# Patient Record
Sex: Male | Born: 1950 | Race: Black or African American | Hispanic: No | Marital: Married
Health system: Southern US, Community
[De-identification: ages and names within clinical notes are randomized; demographics above are authoritative.]

## PROBLEM LIST (undated history)

## (undated) DIAGNOSIS — J9621 Acute and chronic respiratory failure with hypoxia: Secondary | ICD-10-CM

## (undated) DIAGNOSIS — F4310 Post-traumatic stress disorder, unspecified: Secondary | ICD-10-CM

## (undated) DIAGNOSIS — J15211 Pneumonia due to Methicillin susceptible Staphylococcus aureus: Secondary | ICD-10-CM

## (undated) DIAGNOSIS — N170 Acute kidney failure with tubular necrosis: Secondary | ICD-10-CM

## (undated) DIAGNOSIS — I5032 Chronic diastolic (congestive) heart failure: Secondary | ICD-10-CM

---

## 2019-03-16 ENCOUNTER — Inpatient Hospital Stay
Admission: RE | Admit: 2019-03-16 | Discharge: 2019-04-09 | Disposition: A | Discharge status: Skilled Nursing Facility | Payer: Medicare PPO | Attending: Internal Medicine | Admitting: Internal Medicine

## 2019-03-16 ENCOUNTER — Other Ambulatory Visit (HOSPITAL_COMMUNITY): Payer: Medicare PPO

## 2019-03-16 DIAGNOSIS — J15211 Pneumonia due to Methicillin susceptible Staphylococcus aureus: Secondary | ICD-10-CM | POA: Diagnosis present

## 2019-03-16 DIAGNOSIS — F4310 Post-traumatic stress disorder, unspecified: Secondary | ICD-10-CM | POA: Diagnosis not present

## 2019-03-16 DIAGNOSIS — N170 Acute kidney failure with tubular necrosis: Secondary | ICD-10-CM | POA: Diagnosis not present

## 2019-03-16 DIAGNOSIS — J189 Pneumonia, unspecified organism: Secondary | ICD-10-CM

## 2019-03-16 DIAGNOSIS — J9621 Acute and chronic respiratory failure with hypoxia: Secondary | ICD-10-CM

## 2019-03-16 DIAGNOSIS — J9690 Respiratory failure, unspecified, unspecified whether with hypoxia or hypercapnia: Secondary | ICD-10-CM

## 2019-03-16 DIAGNOSIS — I5032 Chronic diastolic (congestive) heart failure: Secondary | ICD-10-CM | POA: Diagnosis present

## 2019-03-16 DIAGNOSIS — R140 Abdominal distension (gaseous): Secondary | ICD-10-CM

## 2019-03-16 DIAGNOSIS — Z931 Gastrostomy status: Secondary | ICD-10-CM

## 2019-03-16 HISTORY — DX: Acute kidney failure with tubular necrosis: N17.0

## 2019-03-16 HISTORY — DX: Pneumonia due to methicillin susceptible Staphylococcus aureus: J15.211

## 2019-03-16 HISTORY — DX: Chronic diastolic (congestive) heart failure: I50.32

## 2019-03-16 HISTORY — DX: Acute and chronic respiratory failure with hypoxia: J96.21

## 2019-03-16 HISTORY — DX: Post-traumatic stress disorder, unspecified: F43.10

## 2019-03-16 NOTE — Consult Note (Signed)
[facility] [place]  PULMONARY SERVICE  Date of Service: 03/16/2019  PULMONARY CRITICAL CARE CONSULT   Dylan Wu  [id number]:[id number]  DOB: April 25, 1950   DOA: 03/16/2019  Referring Physician: Merton Border, MD  HPI: Dylan Wu is a 69 y.o. male seen for follow up of Acute on Chronic Respiratory Failure.  Patient has multiple medical problems including pneumonia acute renal failure hypertension PTSD prostate cancer who presented to the transferring facility with basically with respiratory failure.  Patient was having increasing shortness of breath and cough and congestion.  Had chest x-ray done which showed diffuse interstitial airspace disease and groundglass changes.  Patient was tested for COVID-19 which was negative PCR was also negative.  Eventually patient was found to have MSSA pneumonia.  Patient also was felt to have diastolic heart failure so he was appropriately treated with diuretics.  In addition patient was placed on azithromycin as well as Rocephin along with Decadron.  This was later discontinued once the PCR results were noted.  He did undergo bronchoscopy which is where the cultures came back positive.  Subsequently transferred to our facility for further management and weaning.  Review of Systems:  ROS performed and is unremarkable other than noted above.  Past medical history: Pneumonia Hypertension PTSD Prostate cancer Acute renal failure Ileus Chest pain  Past surgical history: Patient had a tracheostomy PEG  Social history: Unknown about tobacco alcohol or drug abuse  Family history: Noncontributory to the present illness  Medications: Reviewed on Rounds  Physical Exam:  Vitals: Temperature 98.0 pulse 65 respiratory 25 blood pressure is 130/70 saturations 97%  Ventilator Settings off the ventilator right now on T collar with an FiO2 of 40%  . General: Comfortable at this time . Eyes: Grossly normal  lids, irises & conjunctiva . ENT: grossly tongue is normal . Neck: no obvious mass . Cardiovascular: S1-S2 normal no gallop or rub . Respiratory: No rhonchi no rales are noted at this time . Abdomen: Soft and nontender . Skin: no rash seen on limited exam . Musculoskeletal: not rigid . Psychiatric:unable to assess . Neurologic: no seizure no involuntary movements         Labs on Admission:  Basic Metabolic Panel: Recent Labs  Lab 03/17/19 0547  NA 136  K 3.0*  CL 98  CO2 27  GLUCOSE 168*  BUN 24*  CREATININE 0.61  CALCIUM 9.3    No results for input(s): PHART, PCO2ART, PO2ART, HCO3, O2SAT in the last 168 hours.  Liver Function Tests: No results for input(s): AST, ALT, ALKPHOS, BILITOT, PROT, ALBUMIN in the last 168 hours. No results for input(s): LIPASE, AMYLASE in the last 168 hours. No results for input(s): AMMONIA in the last 168 hours.  CBC: Recent Labs  Lab 03/17/19 0547  WBC 8.3  HGB 12.6*  HCT 42.4  MCV 87.4  PLT 210    Cardiac Enzymes: No results for input(s): CKTOTAL, CKMB, CKMBINDEX, TROPONINI in the last 168 hours.  BNP (last 3 results) No results for input(s): BNP in the last 8760 hours.  ProBNP (last 3 results) No results for input(s): PROBNP in the last 8760 hours.   Radiological Exams on Admission: DG Chest Port 1 View  Result Date: 03/16/2019 CLINICAL DATA:  Respiratory failure. EXAM: PORTABLE CHEST 1 VIEW COMPARISON:  None. FINDINGS: The heart size and mediastinal contours are within normal limits. Tracheostomy tube is in grossly good position. Right-sided PICC line is noted with tip in right atrium. No pneumothorax is  noted. Right lung is clear. Mild left basilar subsegmental atelectasis is noted. The visualized skeletal structures are unremarkable. IMPRESSION: Mild left basilar subsegmental atelectasis. Electronically Signed   By: Lupita Raider M.D.   On: 03/16/2019 15:16   DG Abd Portable 1V  Result Date: 03/16/2019 CLINICAL DATA:   Peg tube placement. EXAM: PORTABLE ABDOMEN - 1 VIEW COMPARISON:  None. FINDINGS: Contrast was injected into pre-existing gastrostomy tube. Normal filling of the stomach is noted. No leakage is noted. Gastrostomy tube appears to be well position within gastric lumen. Nasogastric tube tip is seen entering stomach. Mildly dilated small bowel loops are noted which may represent ileus. IMPRESSION: Gastrostomy tube appears to be well positioned within gastric lumen. Mildly dilated small bowel loops are noted which may represent ileus. No leakage is noted. Electronically Signed   By: Lupita Raider M.D.   On: 03/16/2019 15:17    Assessment/Plan Active Problems:   Acute on chronic respiratory failure with hypoxia (HCC)   Chronic diastolic heart failure (HCC)   Pneumonia of lower lobe due to methicillin susceptible Staphylococcus aureus (MSSA) (HCC)   Acute renal failure due to tubular necrosis (HCC)   PTSD (post-traumatic stress disorder)   1. Acute on chronic respiratory failure hypoxia plan is to continue with T collar trials respiratory therapy will assess the patient's readiness for PMV and capping.  As long as he continues to do well we should be able to advance the weaning without any difficulty. 2. Chronic diastolic dysfunction patient has preserved ejection fraction based on the echocardiogram results at the other facility.  We do need to monitor the patient's for the fluid status very closely. 3. MSSA pneumonia treated clinically is resolved we will follow-up radiologically.  Continue with present management patient has been treated with steroids at the other facility. 4. Acute renal failure follow-up on labs which will be ordered for evaluation 5. PTSD at baseline we will continue with supportive care  I have personally seen and evaluated the patient, evaluated laboratory and imaging results, formulated the assessment and plan and placed orders. The Patient requires high complexity decision  making with multiple systems involvement.  Case was discussed on Rounds with the Respiratory Therapy Director and the Respiratory staff Time Spent  Yevonne Pax, MD [facility] Pulmonary Critical Care Medicine Sleep Medicine

## 2019-03-17 ENCOUNTER — Encounter: Payer: Self-pay | Admitting: Internal Medicine

## 2019-03-17 DIAGNOSIS — N170 Acute kidney failure with tubular necrosis: Secondary | ICD-10-CM | POA: Diagnosis not present

## 2019-03-17 DIAGNOSIS — F4310 Post-traumatic stress disorder, unspecified: Secondary | ICD-10-CM | POA: Diagnosis present

## 2019-03-17 DIAGNOSIS — I5032 Chronic diastolic (congestive) heart failure: Secondary | ICD-10-CM | POA: Diagnosis not present

## 2019-03-17 DIAGNOSIS — J9621 Acute and chronic respiratory failure with hypoxia: Secondary | ICD-10-CM | POA: Diagnosis present

## 2019-03-17 DIAGNOSIS — J15211 Pneumonia due to Methicillin susceptible Staphylococcus aureus: Secondary | ICD-10-CM | POA: Diagnosis not present

## 2019-03-17 LAB — COMPREHENSIVE METABOLIC PANEL
ALT: 64 U/L — ABNORMAL HIGH (ref 0–44)
AST: 33 U/L (ref 15–41)
Albumin: 2.7 g/dL — ABNORMAL LOW (ref 3.5–5.0)
Alkaline Phosphatase: 68 U/L (ref 38–126)
Anion gap: 11 (ref 5–15)
BUN: 24 mg/dL — ABNORMAL HIGH (ref 8–23)
CO2: 27 mmol/L (ref 22–32)
Calcium: 9.3 mg/dL (ref 8.9–10.3)
Chloride: 98 mmol/L (ref 98–111)
Creatinine, Ser: 0.61 mg/dL (ref 0.61–1.24)
GFR calc Af Amer: >60 mL/min (ref >60)
GFR calc non Af Amer: >60 mL/min (ref >60)
Glucose, Bld: 168 mg/dL — ABNORMAL HIGH (ref 70–99)
Potassium: 3 mmol/L — ABNORMAL LOW (ref 3.5–5.1)
Sodium: 136 mmol/L (ref 135–145)
Total Bilirubin: 0.7 mg/dL (ref 0.3–1.2)
Total Protein: 5.8 g/dL — ABNORMAL LOW (ref 6.5–8.1)

## 2019-03-17 LAB — CBC
HCT: 42.4 % (ref 39.0–52.0)
Hemoglobin: 12.6 g/dL — ABNORMAL LOW (ref 13.0–17.0)
MCH: 26 pg (ref 26.0–34.0)
MCHC: 29.7 g/dL — ABNORMAL LOW (ref 30.0–36.0)
MCV: 87.4 fL (ref 80.0–100.0)
Platelets: 210 10*3/uL (ref 150–400)
RBC: 4.85 MIL/uL (ref 4.22–5.81)
RDW: 14.4 % (ref 11.5–15.5)
WBC: 8.3 10*3/uL (ref 4.0–10.5)
nRBC: 0 % (ref 0.0–0.2)

## 2019-03-17 NOTE — Progress Notes (Signed)
Pulmonary Critical Care Medicine [facility] [place]   PULMONARY CRITICAL CARE SERVICE  PROGRESS NOTE  Date of Service: 03/17/2019  Dylan Wu  YQM:[id number]  DOB: October 03, 1950   DOA: 03/16/2019  Referring Physician: Carron Curie, MD  HPI: Dylan Wu is a 68 y.o. male seen for follow up of Acute on Chronic Respiratory Failure.  Patient currently is on T collar has been on 35% FiO2 good saturations are noted.  We should be able to progress to capping trials  Medications: Reviewed on Rounds  Physical Exam:  Vitals: Temperature is 98.6 pulse 74 respiratory rate 20 blood pressure is 145/81 saturations 98%  Ventilator Settings off the ventilator on T collar with an FiO2 45%  . General: Comfortable at this time . Eyes: Grossly normal lids, irises & conjunctiva . ENT: grossly tongue is normal . Neck: no obvious mass . Cardiovascular: S1 S2 normal no gallop . Respiratory: No rhonchi coarse breath sounds are noted . Abdomen: soft . Skin: no rash seen on limited exam . Musculoskeletal: not rigid . Psychiatric:unable to assess . Neurologic: no seizure no involuntary movements         Lab Data:   Basic Metabolic Panel: Recent Labs  Lab 03/17/19 0547  NA 136  K 3.0*  CL 98  CO2 27  GLUCOSE 168*  BUN 24*  CREATININE 0.61  CALCIUM 9.3    ABG: No results for input(s): PHART, PCO2ART, PO2ART, HCO3, O2SAT in the last 168 hours.  Liver Function Tests: Recent Labs  Lab 03/17/19 0547  AST 33  ALT 64*  ALKPHOS 68  BILITOT 0.7  PROT 5.8*  ALBUMIN 2.7*   No results for input(s): LIPASE, AMYLASE in the last 168 hours. No results for input(s): AMMONIA in the last 168 hours.  CBC: Recent Labs  Lab 03/17/19 0547  WBC 8.3  HGB 12.6*  HCT 42.4  MCV 87.4  PLT 210    Cardiac Enzymes: No results for input(s): CKTOTAL, CKMB, CKMBINDEX, TROPONINI in the last 168 hours.  BNP (last 3 results) No results for input(s): BNP in the last 8760  hours.  ProBNP (last 3 results) No results for input(s): PROBNP in the last 8760 hours.  Radiological Exams: DG Chest Port 1 View  Result Date: 03/16/2019 CLINICAL DATA:  Respiratory failure. EXAM: PORTABLE CHEST 1 VIEW COMPARISON:  None. FINDINGS: The heart size and mediastinal contours are within normal limits. Tracheostomy tube is in grossly good position. Right-sided PICC line is noted with tip in right atrium. No pneumothorax is noted. Right lung is clear. Mild left basilar subsegmental atelectasis is noted. The visualized skeletal structures are unremarkable. IMPRESSION: Mild left basilar subsegmental atelectasis. Electronically Signed   By: Lupita Raider M.D.   On: 03/16/2019 15:16   DG Abd Portable 1V  Result Date: 03/16/2019 CLINICAL DATA:  Peg tube placement. EXAM: PORTABLE ABDOMEN - 1 VIEW COMPARISON:  None. FINDINGS: Contrast was injected into pre-existing gastrostomy tube. Normal filling of the stomach is noted. No leakage is noted. Gastrostomy tube appears to be well position within gastric lumen. Nasogastric tube tip is seen entering stomach. Mildly dilated small bowel loops are noted which may represent ileus. IMPRESSION: Gastrostomy tube appears to be well positioned within gastric lumen. Mildly dilated small bowel loops are noted which may represent ileus. No leakage is noted. Electronically Signed   By: Lupita Raider M.D.   On: 03/16/2019 15:17    Assessment/Plan Active Problems:   Acute on chronic respiratory failure with hypoxia (HCC)  Chronic diastolic heart failure (HCC)   Pneumonia of lower lobe due to methicillin susceptible Staphylococcus aureus (MSSA) (HCC)   Acute renal failure due to tubular necrosis (HCC)   PTSD (post-traumatic stress disorder)   1. Acute on chronic respiratory failure with hypoxia patient is doing well with the T collar on 35% FiO2 plan will be to continue with T collar trials as ordered continue secretion management pulmonary  toilet. 2. Chronic diastolic heart failure compensated we will continue with present management 3. MSSA pneumonia treated last chest x-ray that was done shows mild basilar atelectatic changes 4. Acute renal failure due to tubular necrosis we will continue with present management 5. PTSD no changes we will continue to follow along   I have personally seen and evaluated the patient, evaluated laboratory and imaging results, formulated the assessment and plan and placed orders. The Patient requires high complexity decision making with multiple systems involvement.  Rounds were done with the Respiratory Therapy Director and Staff therapists and discussed with nursing staff also.  Allyne Gee, MD [facility] Pulmonary Critical Care Medicine Sleep Medicine

## 2019-03-18 DIAGNOSIS — J9621 Acute and chronic respiratory failure with hypoxia: Secondary | ICD-10-CM | POA: Diagnosis not present

## 2019-03-18 DIAGNOSIS — J15211 Pneumonia due to Methicillin susceptible Staphylococcus aureus: Secondary | ICD-10-CM | POA: Diagnosis not present

## 2019-03-18 DIAGNOSIS — I5032 Chronic diastolic (congestive) heart failure: Secondary | ICD-10-CM | POA: Diagnosis not present

## 2019-03-18 DIAGNOSIS — N170 Acute kidney failure with tubular necrosis: Secondary | ICD-10-CM | POA: Diagnosis not present

## 2019-03-18 LAB — POTASSIUM: Potassium: 2.8 mmol/L — ABNORMAL LOW (ref 3.5–5.1)

## 2019-03-18 NOTE — Progress Notes (Signed)
Pulmonary Critical Care Medicine [name]  PROGRESS NOTE  Date of Service: 03/18/2019  Dylan Wu  [id number]  DOB: 01-Nov-1950   DOA: 03/16/2019  Referring Physician: Merton Border, MD  HPI: Dylan Wu is a 69 y.o. male seen for follow up of Acute on Chronic Respiratory Failure.  Patient is on T collar currently has been on 35% FiO2 doing well without any major issues at this time tracheostomy ready to be changed  Medications: Reviewed on Rounds  Physical Exam:  Vitals: Temperature 98.0 pulse 77 respiratory 29 blood pressure is 127/71 saturations 93%  Ventilator Settings off the ventilator on T collar FiO2 35%  . General: Comfortable at this time . Eyes: Grossly normal lids, irises & conjunctiva . ENT: grossly tongue is normal . Neck: no obvious mass . Cardiovascular: S1 S2 normal no gallop . Respiratory: Coarse breath sounds with few scattered rhonchi . Abdomen: soft . Skin: no rash seen on limited exam . Musculoskeletal: not rigid . Psychiatric:unable to assess . Neurologic: no seizure no involuntary movements         Lab Data:   Basic Metabolic Panel: Recent Labs  Lab 03/17/19 0547  NA 136  K 3.0*  CL 98  CO2 27  GLUCOSE 168*  BUN 24*  CREATININE 0.61  CALCIUM 9.3    ABG: No results for input(s): PHART, PCO2ART, PO2ART, HCO3, O2SAT in the last 168 hours.  Liver Function Tests: Recent Labs  Lab 03/17/19 0547  AST 33  ALT 64*  ALKPHOS 68  BILITOT 0.7  PROT 5.8*  ALBUMIN 2.7*   No results for input(s): LIPASE, AMYLASE in the last 168 hours. No results for input(s): AMMONIA in the last 168 hours.  CBC: Recent Labs  Lab 03/17/19 0547  WBC 8.3  HGB 12.6*  HCT 42.4  MCV 87.4  PLT 210    Cardiac Enzymes: No results for input(s): CKTOTAL, CKMB, CKMBINDEX, TROPONINI in the last 168 hours.  BNP (last 3 results) No results for input(s): BNP in the last 8760 hours.  ProBNP  (last 3 results) No results for input(s): PROBNP in the last 8760 hours.  Radiological Exams: DG Chest Port 1 View  Result Date: 03/16/2019 CLINICAL DATA:  Respiratory failure. EXAM: PORTABLE CHEST 1 VIEW COMPARISON:  None. FINDINGS: The heart size and mediastinal contours are within normal limits. Tracheostomy tube is in grossly good position. Right-sided PICC line is noted with tip in right atrium. No pneumothorax is noted. Right lung is clear. Mild left basilar subsegmental atelectasis is noted. The visualized skeletal structures are unremarkable. IMPRESSION: Mild left basilar subsegmental atelectasis. Electronically Signed   By: Marijo Conception M.D.   On: 03/16/2019 15:16   DG Abd Portable 1V  Result Date: 03/16/2019 CLINICAL DATA:  Peg tube placement. EXAM: PORTABLE ABDOMEN - 1 VIEW COMPARISON:  None. FINDINGS: Contrast was injected into pre-existing gastrostomy tube. Normal filling of the stomach is noted. No leakage is noted. Gastrostomy tube appears to be well position within gastric lumen. Nasogastric tube tip is seen entering stomach. Mildly dilated small bowel loops are noted which may represent ileus. IMPRESSION: Gastrostomy tube appears to be well positioned within gastric lumen. Mildly dilated small bowel loops are noted which may represent ileus. No leakage is noted. Electronically Signed   By: Marijo Conception M.D.   On: 03/16/2019 15:17    Assessment/Plan Active Problems:   Acute on chronic respiratory failure with hypoxia (HCC)   Chronic diastolic  heart failure (HCC)   Pneumonia of lower lobe due to methicillin susceptible Staphylococcus aureus (MSSA) (HCC)   Acute renal failure due to tubular necrosis (HCC)   PTSD (post-traumatic stress disorder)   1. Acute on chronic respiratory failure with hypoxia plan is to continue with the weaning on T collar right now is on 35% FiO2 patient will get a change in the tracheostomy done today. 2. Chronic diastolic heart failure right now  is compensated there was some basilar atelectasis noted on the last chest film continue with aggressive pulmonary toilet 3. Pneumonia treated secondary to MSSA pneumonia we will follow 4. Acute renal failure resolved we will continue to follow labs 5. PTSD no change   I have personally seen and evaluated the patient, evaluated laboratory and imaging results, formulated the assessment and plan and placed orders. The Patient requires high complexity decision making with multiple systems involvement.  Time 35 minutes tracheostomy change Rounds were done with the Respiratory Therapy Director and Staff therapists and discussed with nursing staff also.  Yevonne Pax, MD [facility] Pulmonary Critical Care Medicine Sleep Medicine

## 2019-03-19 DIAGNOSIS — J9621 Acute and chronic respiratory failure with hypoxia: Secondary | ICD-10-CM | POA: Diagnosis not present

## 2019-03-19 DIAGNOSIS — I5032 Chronic diastolic (congestive) heart failure: Secondary | ICD-10-CM | POA: Diagnosis not present

## 2019-03-19 DIAGNOSIS — N170 Acute kidney failure with tubular necrosis: Secondary | ICD-10-CM | POA: Diagnosis not present

## 2019-03-19 DIAGNOSIS — J15211 Pneumonia due to Methicillin susceptible Staphylococcus aureus: Secondary | ICD-10-CM | POA: Diagnosis not present

## 2019-03-19 LAB — POTASSIUM: Potassium: 3.3 mmol/L — ABNORMAL LOW (ref 3.5–5.1)

## 2019-03-19 NOTE — Progress Notes (Addendum)
Pulmonary Critical Care Medicine [facility] [place]   PULMONARY CRITICAL CARE SERVICE  PROGRESS NOTE  Date of Service: 03/19/2019  Dylan Wu  ELF:[id number]  DOB: [birth year]   DOA: 03/16/2019  Referring Physician: Carron Curie, MD  HPI: Dylan Wu is a 69 y.o. male seen for follow up of Acute on Chronic Respiratory Failure.  Patient is currently on T collar has been on 40% FiO2  Medications: Reviewed on Rounds  Physical Exam:  Vitals: Temperature 98.3 pulse 86 respiratory rate 26 blood pressure 138/81 saturations 91%  Ventilator Settings mode ventilation is off the ventilator on T collar with an FiO2 40%  . General: Comfortable at this time . Eyes: Grossly normal lids, irises & conjunctiva . ENT: grossly tongue is normal . Neck: no obvious mass . Cardiovascular: S1 S2 normal no gallop . Respiratory: No rhonchi no rales are noted at this time . Abdomen: soft . Skin: no rash seen on limited exam . Musculoskeletal: not rigid . Psychiatric:unable to assess . Neurologic: no seizure no involuntary movements         Lab Data:   Basic Metabolic Panel: Recent Labs  Lab 03/17/19 0547 03/18/19 1222  NA 136  --   K 3.0* 2.8*  CL 98  --   CO2 27  --   GLUCOSE 168*  --   BUN 24*  --   CREATININE 0.61  --   CALCIUM 9.3  --     ABG: No results for input(s): PHART, PCO2ART, PO2ART, HCO3, O2SAT in the last 168 hours.  Liver Function Tests: Recent Labs  Lab 03/17/19 0547  AST 33  ALT 64*  ALKPHOS 68  BILITOT 0.7  PROT 5.8*  ALBUMIN 2.7*   No results for input(s): LIPASE, AMYLASE in the last 168 hours. No results for input(s): AMMONIA in the last 168 hours.  CBC: Recent Labs  Lab 03/17/19 0547  WBC 8.3  HGB 12.6*  HCT 42.4  MCV 87.4  PLT 210    Cardiac Enzymes: No results for input(s): CKTOTAL, CKMB, CKMBINDEX, TROPONINI in the last 168 hours.  BNP (last 3 results) No results for input(s): BNP in the last 8760 hours.  ProBNP  (last 3 results) No results for input(s): PROBNP in the last 8760 hours.  Radiological Exams: No results found.  Assessment/Plan Active Problems:   Acute on chronic respiratory failure with hypoxia (HCC)   Chronic diastolic heart failure (HCC)   Pneumonia of lower lobe due to methicillin susceptible Staphylococcus aureus (MSSA) (HCC)   Acute renal failure due to tubular necrosis (HCC)   PTSD (post-traumatic stress disorder)   1. Acute on chronic respiratory failure with hypoxia plan is to continue with T collar trials as tolerated FiO2 requirements are slightly increased we will need to monitor closely 2. Chronic diastolic heart failure monitor fluid status 3. Pneumonia treated we will continue with supportive care 4. Acute renal failure continue present management 5. PTSD no changes continue with supportive care   I have personally seen and evaluated the patient, evaluated laboratory and imaging results, formulated the assessment and plan and placed orders. The Patient requires high complexity decision making with multiple systems involvement.  Rounds were done with the Respiratory Therapy Director and Staff therapists and discussed with nursing staff also.  Yevonne Pax, MD [facility] Pulmonary Critical Care Medicine Sleep Medicine

## 2019-03-20 ENCOUNTER — Other Ambulatory Visit (HOSPITAL_COMMUNITY): Payer: Medicare PPO

## 2019-03-20 DIAGNOSIS — J9621 Acute and chronic respiratory failure with hypoxia: Secondary | ICD-10-CM | POA: Diagnosis not present

## 2019-03-20 DIAGNOSIS — J15211 Pneumonia due to Methicillin susceptible Staphylococcus aureus: Secondary | ICD-10-CM | POA: Diagnosis not present

## 2019-03-20 DIAGNOSIS — N170 Acute kidney failure with tubular necrosis: Secondary | ICD-10-CM | POA: Diagnosis not present

## 2019-03-20 DIAGNOSIS — I5032 Chronic diastolic (congestive) heart failure: Secondary | ICD-10-CM | POA: Diagnosis not present

## 2019-03-20 NOTE — Progress Notes (Addendum)
Pulmonary Critical Care Medicine [facility] [place]   PULMONARY CRITICAL CARE SERVICE  PROGRESS NOTE  Date of Service: 03/20/2019  Dylan Wu  WVP:[id number]  DOB: April 29, 1950   DOA: 03/16/2019  Referring Physician: Carron Curie, MD  HPI: Dylan Wu is a 69 y.o. male seen for follow up of Acute on Chronic Respiratory Failure.  Patient remains on trach collar 50% FiO2 satting well this time distress.  Medications: Reviewed on Rounds  Physical Exam:  Vitals: Pulse 88 respirations 17 BP 106/65 O2 sat 93% temp 97.4  Ventilator Settings 50% ATC  . General: Comfortable at this time . Eyes: Grossly normal lids, irises & conjunctiva . ENT: grossly tongue is normal . Neck: no obvious mass . Cardiovascular: S1 S2 normal no gallop . Respiratory: No rales or rhonchi noted . Abdomen: soft . Skin: no rash seen on limited exam . Musculoskeletal: not rigid . Psychiatric:unable to assess . Neurologic: no seizure no involuntary movements         Lab Data:   Basic Metabolic Panel: Recent Labs  Lab 03/17/19 0547 03/18/19 1222 03/19/19 1034  NA 136  --   --   K 3.0* 2.8* 3.3*  CL 98  --   --   CO2 27  --   --   GLUCOSE 168*  --   --   BUN 24*  --   --   CREATININE 0.61  --   --   CALCIUM 9.3  --   --     ABG: No results for input(s): PHART, PCO2ART, PO2ART, HCO3, O2SAT in the last 168 hours.  Liver Function Tests: Recent Labs  Lab 03/17/19 0547  AST 33  ALT 64*  ALKPHOS 68  BILITOT 0.7  PROT 5.8*  ALBUMIN 2.7*   No results for input(s): LIPASE, AMYLASE in the last 168 hours. No results for input(s): AMMONIA in the last 168 hours.  CBC: Recent Labs  Lab 03/17/19 0547  WBC 8.3  HGB 12.6*  HCT 42.4  MCV 87.4  PLT 210    Cardiac Enzymes: No results for input(s): CKTOTAL, CKMB, CKMBINDEX, TROPONINI in the last 168 hours.  BNP (last 3 results) No results for input(s): BNP in the last 8760 hours.  ProBNP (last 3 results) No results for  input(s): PROBNP in the last 8760 hours.  Radiological Exams: DG CHEST PORT 1 VIEW  Result Date: 03/20/2019 CLINICAL DATA:  68 year old male with history of pneumonia. EXAM: PORTABLE CHEST 1 VIEW COMPARISON:  Chest x-ray 03/16/2019. FINDINGS: A tracheostomy tube is in place with tip 5.8 cm above the carina. There is a right upper extremity PICC with tip terminating in the right atrium. Lung volumes are low. Bibasilar opacities (right greater than left), favored to reflect predominantly subsegmental atelectasis, although underlying airspace consolidation is not entirely excluded. No definite pleural effusions. No evidence of pulmonary edema. No pneumothorax. Heart size is borderline enlarged, accentuated by low lung volumes, portable AP technique, and slight rotation of the patient to the right which also distorts upper mediastinal contours. IMPRESSION: 1. Support apparatus, as above. 2. Low lung volumes with probable bibasilar subsegmental atelectasis. Electronically Signed   By: Trudie Reed M.D.   On: 03/20/2019 13:50    Assessment/Plan Active Problems:   Acute on chronic respiratory failure with hypoxia (HCC)   Chronic diastolic heart failure (HCC)   Pneumonia of lower lobe due to methicillin susceptible Staphylococcus aureus (MSSA) (HCC)   Acute renal failure due to tubular necrosis [facility])   PTSD (post-traumatic  stress disorder)   1. Acute on chronic respiratory failure with hypoxia patient is currently on 50% aerosol trach collar using PMV with no difficulty.  Continue aggressive pulmonary toilet and supportive measures.  Continue to wean per protocol. 2. Chronic diastolic heart failure monitor fluid status 3. Pneumonia treated we will continue with supportive care 4. Acute renal failure continue present management 5. PTSD no changes continue with supportive care   I have personally seen and evaluated the patient, evaluated laboratory and imaging results, formulated the assessment and  plan and placed orders. The Patient requires high complexity decision making with multiple systems involvement.  Rounds were done with the Respiratory Therapy Director and Staff therapists and discussed with nursing staff also.  Allyne Gee, MD [facility] Pulmonary Critical Care Medicine Sleep Medicine

## 2019-03-21 DIAGNOSIS — I5032 Chronic diastolic (congestive) heart failure: Secondary | ICD-10-CM | POA: Diagnosis not present

## 2019-03-21 DIAGNOSIS — N170 Acute kidney failure with tubular necrosis: Secondary | ICD-10-CM | POA: Diagnosis not present

## 2019-03-21 DIAGNOSIS — J15211 Pneumonia due to Methicillin susceptible Staphylococcus aureus: Secondary | ICD-10-CM | POA: Diagnosis not present

## 2019-03-21 DIAGNOSIS — J9621 Acute and chronic respiratory failure with hypoxia: Secondary | ICD-10-CM | POA: Diagnosis not present

## 2019-03-21 LAB — BASIC METABOLIC PANEL
Anion gap: 10 (ref 5–15)
BUN: 50 mg/dL — ABNORMAL HIGH (ref 8–23)
CO2: 31 mmol/L (ref 22–32)
Calcium: 9.6 mg/dL (ref 8.9–10.3)
Chloride: 98 mmol/L (ref 98–111)
Creatinine, Ser: 0.81 mg/dL (ref 0.61–1.24)
GFR calc Af Amer: >60 mL/min (ref >60)
GFR calc non Af Amer: >60 mL/min (ref >60)
Glucose, Bld: 189 mg/dL — ABNORMAL HIGH (ref 70–99)
Potassium: 2.8 mmol/L — ABNORMAL LOW (ref 3.5–5.1)
Sodium: 139 mmol/L (ref 135–145)

## 2019-03-21 LAB — CBC
HCT: 40.9 % (ref 39.0–52.0)
Hemoglobin: 12.3 g/dL — ABNORMAL LOW (ref 13.0–17.0)
MCH: 25.7 pg — ABNORMAL LOW (ref 26.0–34.0)
MCHC: 30.1 g/dL (ref 30.0–36.0)
MCV: 85.4 fL (ref 80.0–100.0)
Platelets: 223 10*3/uL (ref 150–400)
RBC: 4.79 MIL/uL (ref 4.22–5.81)
RDW: 14.4 % (ref 11.5–15.5)
WBC: 7 10*3/uL (ref 4.0–10.5)
nRBC: 0 % (ref 0.0–0.2)

## 2019-03-21 LAB — MAGNESIUM: Magnesium: 2.5 mg/dL — ABNORMAL HIGH (ref 1.7–2.4)

## 2019-03-21 NOTE — Progress Notes (Signed)
Pulmonary Critical Care Medicine [place]   PULMONARY CRITICAL CARE SERVICE  PROGRESS NOTE  Date of Service: 03/21/2019  Dylan Wu  IPJ:[id number]  DOB: October 12, 1950   DOA: 03/16/2019  Referring Physician: Merton Border, MD  HPI: Dylan Wu is a 69 y.o. male seen for follow up of Acute on Chronic Respiratory Failure.  Patient currently is on T collar has been on 50% FiO2 using PMV  Medications: Reviewed on Rounds  Physical Exam:  Vitals: Temperature 98.0 pulse 77 respiratory 15 blood pressure is 110/46 saturations 94%  Ventilator Settings on T collar with an FiO2 of 50%  . General: Comfortable at this time . Eyes: Grossly normal lids, irises & conjunctiva . ENT: grossly tongue is normal . Neck: no obvious mass . Cardiovascular: S1 S2 normal no gallop . Respiratory: No rhonchi no rales . Abdomen: soft . Skin: no rash seen on limited exam . Musculoskeletal: not rigid . Psychiatric:unable to assess . Neurologic: no seizure no involuntary movements         Lab Data:   Basic Metabolic Panel: Recent Labs  Lab 03/17/19 0547 03/18/19 1222 03/19/19 1034 03/21/19 0707  NA 136  --   --  139  K 3.0* 2.8* 3.3* 2.8*  CL 98  --   --  98  CO2 27  --   --  31  GLUCOSE 168*  --   --  189*  BUN 24*  --   --  50*  CREATININE 0.61  --   --  0.81  CALCIUM 9.3  --   --  9.6  MG  --   --   --  2.5*    ABG: No results for input(s): PHART, PCO2ART, PO2ART, HCO3, O2SAT in the last 168 hours.  Liver Function Tests: Recent Labs  Lab 03/17/19 0547  AST 33  ALT 64*  ALKPHOS 68  BILITOT 0.7  PROT 5.8*  ALBUMIN 2.7*   No results for input(s): LIPASE, AMYLASE in the last 168 hours. No results for input(s): AMMONIA in the last 168 hours.  CBC: Recent Labs  Lab 03/17/19 0547 03/21/19 0707  WBC 8.3 7.0  HGB 12.6* 12.3*  HCT 42.4 40.9  MCV 87.4 85.4  PLT 210 223    Cardiac Enzymes: No results for input(s): CKTOTAL, CKMB, CKMBINDEX,  TROPONINI in the last 168 hours.  BNP (last 3 results) No results for input(s): BNP in the last 8760 hours.  ProBNP (last 3 results) No results for input(s): PROBNP in the last 8760 hours.  Radiological Exams: DG CHEST PORT 1 VIEW  Result Date: 03/20/2019 CLINICAL DATA:  69 year old male with history of pneumonia. EXAM: PORTABLE CHEST 1 VIEW COMPARISON:  Chest x-ray 03/16/2019. FINDINGS: A tracheostomy tube is in place with tip 5.8 cm above the carina. There is a right upper extremity PICC with tip terminating in the right atrium. Lung volumes are low. Bibasilar opacities (right greater than left), favored to reflect predominantly subsegmental atelectasis, although underlying airspace consolidation is not entirely excluded. No definite pleural effusions. No evidence of pulmonary edema. No pneumothorax. Heart size is borderline enlarged, accentuated by low lung volumes, portable AP technique, and slight rotation of the patient to the right which also distorts upper mediastinal contours. IMPRESSION: 1. Support apparatus, as above. 2. Low lung volumes with probable bibasilar subsegmental atelectasis. Electronically Signed   By: Vinnie Langton M.D.   On: 03/20/2019 13:50    Assessment/Plan Active Problems:   Acute on chronic respiratory failure with hypoxia (  HCC)   Chronic diastolic heart failure (HCC)   Pneumonia of lower lobe due to methicillin susceptible Staphylococcus aureus (MSSA) (HCC)   Acute renal failure due to tubular necrosis (HCC)   PTSD (post-traumatic stress disorder)   1. Acute on chronic respiratory failure hypoxia plan is to continue with T collar on 50% FiO2 with PMV would like to advance as tolerated 2. Chronic diastolic heart failure compensated 3. Pneumonia due to MSSA treated improving 4. ATN resolving 5. PTSD at baseline   I have personally seen and evaluated the patient, evaluated laboratory and imaging results, formulated the assessment and plan and placed  orders. The Patient requires high complexity decision making with multiple systems involvement.  Rounds were done with the Respiratory Therapy Director and Staff therapists and discussed with nursing staff also.  Yevonne Pax, MD [facility] Pulmonary Critical Care Medicine Sleep Medicine

## 2019-03-22 DIAGNOSIS — N170 Acute kidney failure with tubular necrosis: Secondary | ICD-10-CM | POA: Diagnosis not present

## 2019-03-22 DIAGNOSIS — I5032 Chronic diastolic (congestive) heart failure: Secondary | ICD-10-CM | POA: Diagnosis not present

## 2019-03-22 DIAGNOSIS — J9621 Acute and chronic respiratory failure with hypoxia: Secondary | ICD-10-CM | POA: Diagnosis not present

## 2019-03-22 DIAGNOSIS — J15211 Pneumonia due to Methicillin susceptible Staphylococcus aureus: Secondary | ICD-10-CM | POA: Diagnosis not present

## 2019-03-22 LAB — POTASSIUM
Potassium: 3 mmol/L — ABNORMAL LOW (ref 3.5–5.1)
Potassium: 3 mmol/L — ABNORMAL LOW (ref 3.5–5.1)

## 2019-03-22 NOTE — Progress Notes (Signed)
Pulmonary Critical Care Medicine [place]   PULMONARY CRITICAL CARE SERVICE  PROGRESS NOTE  Date of Service: 03/22/2019  Dylan Wu  [id number]  DOB: 1950/04/14   DOA: 03/16/2019  Referring Physician: Merton Border, MD  HPI: Dylan Wu is a 69 y.o. male seen for follow up of Acute on Chronic Respiratory Failure.  Patient currently is on T collar has been on 40% FiO2 with good saturations are noted  Medications: Reviewed on Rounds  Physical Exam:  Vitals: Temperature 97.9 pulse 94 respiratory rate 14 blood pressure is 152/70 saturations 93%  Ventilator Settings off the ventilator on T collar  . General: Comfortable at this time . Eyes: Grossly normal lids, irises & conjunctiva . ENT: grossly tongue is normal . Neck: no obvious mass . Cardiovascular: S1 S2 normal no gallop . Respiratory: No rhonchi no rales are noted at this time . Abdomen: soft . Skin: no rash seen on limited exam . Musculoskeletal: not rigid . Psychiatric:unable to assess . Neurologic: no seizure no involuntary movements         Lab Data:   Basic Metabolic Panel: Recent Labs  Lab 03/17/19 0547 03/18/19 1222 03/19/19 1034 03/21/19 0707 03/22/19 0607  NA 136  --   --  139  --   K 3.0* 2.8* 3.3* 2.8* 3.0*  CL 98  --   --  98  --   CO2 27  --   --  31  --   GLUCOSE 168*  --   --  189*  --   BUN 24*  --   --  50*  --   CREATININE 0.61  --   --  0.81  --   CALCIUM 9.3  --   --  9.6  --   MG  --   --   --  2.5*  --     ABG: No results for input(s): PHART, PCO2ART, PO2ART, HCO3, O2SAT in the last 168 hours.  Liver Function Tests: Recent Labs  Lab 03/17/19 0547  AST 33  ALT 64*  ALKPHOS 68  BILITOT 0.7  PROT 5.8*  ALBUMIN 2.7*   No results for input(s): LIPASE, AMYLASE in the last 168 hours. No results for input(s): AMMONIA in the last 168 hours.  CBC: Recent Labs  Lab 03/17/19 0547 03/21/19 0707  WBC 8.3 7.0  HGB 12.6* 12.3*  HCT 42.4 40.9   MCV 87.4 85.4  PLT 210 223    Cardiac Enzymes: No results for input(s): CKTOTAL, CKMB, CKMBINDEX, TROPONINI in the last 168 hours.  BNP (last 3 results) No results for input(s): BNP in the last 8760 hours.  ProBNP (last 3 results) No results for input(s): PROBNP in the last 8760 hours.  Radiological Exams: No results found.  Assessment/Plan Active Problems:   Acute on chronic respiratory failure with hypoxia (HCC)   Chronic diastolic heart failure (HCC)   Pneumonia of lower lobe due to methicillin susceptible Staphylococcus aureus (MSSA) (HCC)   Acute renal failure due to tubular necrosis (HCC)   PTSD (post-traumatic stress disorder)   1. Acute on chronic respiratory failure with hypoxia we will plan on changing a trach and doing capping trials 2. Chronic diastolic heart failure compensated continue with supportive care 3. Pneumonia due to MSSA treated clinically is improving 4. PTSD at baseline 5. Acute renal failure we will monitoring labs   I have personally seen and evaluated the patient, evaluated laboratory and imaging results, formulated the assessment and plan and placed orders.  The Patient requires high complexity decision making with multiple systems involvement.  Time change in care plan potential Rounds were done with the Respiratory Therapy Director and Staff therapists and discussed with nursing staff also.  Yevonne Pax, MD [facility] Pulmonary Critical Care Medicine Sleep Medicine

## 2019-03-23 DIAGNOSIS — J15211 Pneumonia due to Methicillin susceptible Staphylococcus aureus: Secondary | ICD-10-CM | POA: Diagnosis not present

## 2019-03-23 DIAGNOSIS — I5032 Chronic diastolic (congestive) heart failure: Secondary | ICD-10-CM | POA: Diagnosis not present

## 2019-03-23 DIAGNOSIS — N170 Acute kidney failure with tubular necrosis: Secondary | ICD-10-CM | POA: Diagnosis not present

## 2019-03-23 DIAGNOSIS — J9621 Acute and chronic respiratory failure with hypoxia: Secondary | ICD-10-CM | POA: Diagnosis not present

## 2019-03-23 LAB — CULTURE, RESPIRATORY W GRAM STAIN

## 2019-03-23 LAB — POTASSIUM: Potassium: 3.6 mmol/L (ref 3.5–5.1)

## 2019-03-23 NOTE — Progress Notes (Signed)
Pulmonary Critical Care Medicine [facility] [place]   PULMONARY CRITICAL CARE SERVICE  PROGRESS NOTE  Date of Service: 03/23/2019  Dylan Wu  OZH:[id number]  DOB: 1951-01-09   DOA: 03/16/2019  Referring Physician: Carron Curie, MD  HPI: Dylan Wu is a 69 y.o. male seen for follow up of Acute on Chronic Respiratory Failure.  Patient is capping right now 24 hours no distress is noted at this time  Medications: Reviewed on Rounds  Physical Exam:  Vitals: Temperature 96.2 pulse 73 respiratory 17 blood pressure 97/52 saturations 98%  Ventilator Settings capping right now off the ventilator  . General: Comfortable at this time . Eyes: Grossly normal lids, irises & conjunctiva . ENT: grossly tongue is normal . Neck: no obvious mass . Cardiovascular: S1 S2 normal no gallop . Respiratory: No rhonchi no rales are noted at this time . Abdomen: soft . Skin: no rash seen on limited exam . Musculoskeletal: not rigid . Psychiatric:unable to assess . Neurologic: no seizure no involuntary movements         Lab Data:   Basic Metabolic Panel: Recent Labs  Lab 03/17/19 0547 03/18/19 1222 03/19/19 1034 03/21/19 0707 03/22/19 0607 03/22/19 2018 03/23/19 0627  NA 136  --   --  139  --   --   --   K 3.0*   < > 3.3* 2.8* 3.0* 3.0* 3.6  CL 98  --   --  98  --   --   --   CO2 27  --   --  31  --   --   --   GLUCOSE 168*  --   --  189*  --   --   --   BUN 24*  --   --  50*  --   --   --   CREATININE 0.61  --   --  0.81  --   --   --   CALCIUM 9.3  --   --  9.6  --   --   --   MG  --   --   --  2.5*  --   --   --    < > = values in this interval not displayed.    ABG: No results for input(s): PHART, PCO2ART, PO2ART, HCO3, O2SAT in the last 168 hours.  Liver Function Tests: Recent Labs  Lab 03/17/19 0547  AST 33  ALT 64*  ALKPHOS 68  BILITOT 0.7  PROT 5.8*  ALBUMIN 2.7*   No results for input(s): LIPASE, AMYLASE in the last 168 hours. No results for  input(s): AMMONIA in the last 168 hours.  CBC: Recent Labs  Lab 03/17/19 0547 03/21/19 0707  WBC 8.3 7.0  HGB 12.6* 12.3*  HCT 42.4 40.9  MCV 87.4 85.4  PLT 210 223    Cardiac Enzymes: No results for input(s): CKTOTAL, CKMB, CKMBINDEX, TROPONINI in the last 168 hours.  BNP (last 3 results) No results for input(s): BNP in the last 8760 hours.  ProBNP (last 3 results) No results for input(s): PROBNP in the last 8760 hours.  Radiological Exams: No results found.  Assessment/Plan Active Problems:   Acute on chronic respiratory failure with hypoxia (HCC)   Chronic diastolic heart failure (HCC)   Pneumonia of lower lobe due to methicillin susceptible Staphylococcus aureus (MSSA) (HCC)   Acute renal failure due to tubular necrosis (HCC)   PTSD (post-traumatic stress disorder)   1. Acute on chronic respiratory failure hypoxia we will  continue with capping trials once the patient reaches 72 hours plan on decannulation 2. Chronic diastolic heart failure compensated 3. Pneumonia treated improving 4. Acute renal failure no change 5. PTSD treated we will continue with supportive care   I have personally seen and evaluated the patient, evaluated laboratory and imaging results, formulated the assessment and plan and placed orders. The Patient requires high complexity decision making with multiple systems involvement.  Rounds were done with the Respiratory Therapy Director and Staff therapists and discussed with nursing staff also.  Allyne Gee, MD [facility] Pulmonary Critical Care Medicine Sleep Medicine

## 2019-03-24 DIAGNOSIS — J9621 Acute and chronic respiratory failure with hypoxia: Secondary | ICD-10-CM | POA: Diagnosis not present

## 2019-03-24 DIAGNOSIS — N170 Acute kidney failure with tubular necrosis: Secondary | ICD-10-CM | POA: Diagnosis not present

## 2019-03-24 DIAGNOSIS — I5032 Chronic diastolic (congestive) heart failure: Secondary | ICD-10-CM | POA: Diagnosis not present

## 2019-03-24 DIAGNOSIS — J15211 Pneumonia due to Methicillin susceptible Staphylococcus aureus: Secondary | ICD-10-CM | POA: Diagnosis not present

## 2019-03-24 LAB — CBC
HCT: 41.2 % (ref 39.0–52.0)
Hemoglobin: 12 g/dL — ABNORMAL LOW (ref 13.0–17.0)
MCH: 25.8 pg — ABNORMAL LOW (ref 26.0–34.0)
MCHC: 29.1 g/dL — ABNORMAL LOW (ref 30.0–36.0)
MCV: 88.6 fL (ref 80.0–100.0)
Platelets: 257 10*3/uL (ref 150–400)
RBC: 4.65 MIL/uL (ref 4.22–5.81)
RDW: 14.6 % (ref 11.5–15.5)
WBC: 7.4 10*3/uL (ref 4.0–10.5)
nRBC: 0 % (ref 0.0–0.2)

## 2019-03-24 NOTE — Progress Notes (Signed)
Pulmonary Critical Care Medicine [facility] [place]   PULMONARY CRITICAL CARE SERVICE  PROGRESS NOTE  Date of Service: 03/24/2019  Dylan Wu  [id number]:[id number]  DOB: May 18, 1950   DOA: 03/16/2019  Referring Physician: Carron Curie, MD  HPI: Dylan Wu is a 69 y.o. male seen for follow up of Acute on Chronic Respiratory Failure.  Patient currently is capping today will be 48 hours is using 2 L oxygen with good saturations  Medications: Reviewed on Rounds  Physical Exam:  Vitals: Temperature 98.0 pulse 77 respiratory 16 blood pressure is 127/50 saturations 97%  Ventilator Settings capping on 48-hour goal  . General: Comfortable at this time . Eyes: Grossly normal lids, irises & conjunctiva . ENT: grossly tongue is normal . Neck: no obvious mass . Cardiovascular: S1 S2 normal no gallop . Respiratory: No rhonchi no rales are noted . Abdomen: soft . Skin: no rash seen on limited exam . Musculoskeletal: not rigid . Psychiatric:unable to assess . Neurologic: no seizure no involuntary movements         Lab Data:   Basic Metabolic Panel: Recent Labs  Lab 03/19/19 1034 03/21/19 0707 03/22/19 0607 03/22/19 2018 03/23/19 0627  NA  --  139  --   --   --   K 3.3* 2.8* 3.0* 3.0* 3.6  CL  --  98  --   --   --   CO2  --  31  --   --   --   GLUCOSE  --  189*  --   --   --   BUN  --  50*  --   --   --   CREATININE  --  0.81  --   --   --   CALCIUM  --  9.6  --   --   --   MG  --  2.5*  --   --   --     ABG: No results for input(s): PHART, PCO2ART, PO2ART, HCO3, O2SAT in the last 168 hours.  Liver Function Tests: No results for input(s): AST, ALT, ALKPHOS, BILITOT, PROT, ALBUMIN in the last 168 hours. No results for input(s): LIPASE, AMYLASE in the last 168 hours. No results for input(s): AMMONIA in the last 168 hours.  CBC: Recent Labs  Lab 03/21/19 0707 03/24/19 0431  WBC 7.0 7.4  HGB 12.3* 12.0*  HCT 40.9 41.2  MCV 85.4 88.6  PLT 223 257     Cardiac Enzymes: No results for input(s): CKTOTAL, CKMB, CKMBINDEX, TROPONINI in the last 168 hours.  BNP (last 3 results) No results for input(s): BNP in the last 8760 hours.  ProBNP (last 3 results) No results for input(s): PROBNP in the last 8760 hours.  Radiological Exams: No results found.  Assessment/Plan Active Problems:   Acute on chronic respiratory failure with hypoxia (HCC)   Chronic diastolic heart failure (HCC)   Pneumonia of lower lobe due to methicillin susceptible Staphylococcus aureus (MSSA) (HCC)   Acute renal failure due to tubular necrosis (HCC)   PTSD (post-traumatic stress disorder)   1. Acute on chronic respiratory failure hypoxia continue with capping trials as ordered 2 L supplemental oxygen 2. Chronic diastolic heart failure compensated 3. Pneumonia due to MSSA treated improving 4. Acute renal failure following labs 5. PTSD no change   I have personally seen and evaluated the patient, evaluated laboratory and imaging results, formulated the assessment and plan and placed orders. The Patient requires high complexity decision making with multiple systems involvement.  Rounds were done with the Respiratory Therapy Director and Staff therapists and discussed with nursing staff also.  Allyne Gee, MD [facility] Pulmonary Critical Care Medicine Sleep Medicine

## 2019-03-25 DIAGNOSIS — I5032 Chronic diastolic (congestive) heart failure: Secondary | ICD-10-CM | POA: Diagnosis not present

## 2019-03-25 DIAGNOSIS — J9621 Acute and chronic respiratory failure with hypoxia: Secondary | ICD-10-CM | POA: Diagnosis not present

## 2019-03-25 DIAGNOSIS — J15211 Pneumonia due to Methicillin susceptible Staphylococcus aureus: Secondary | ICD-10-CM | POA: Diagnosis not present

## 2019-03-25 DIAGNOSIS — N170 Acute kidney failure with tubular necrosis: Secondary | ICD-10-CM | POA: Diagnosis not present

## 2019-03-25 LAB — BLOOD GAS, ARTERIAL
Acid-Base Excess: 13.8 mmol/L — ABNORMAL HIGH (ref 0.0–2.0)
Bicarbonate: 38.5 mmol/L — ABNORMAL HIGH (ref 20.0–28.0)
FIO2: 32
O2 Saturation: 90.8 %
Patient temperature: 37
pCO2 arterial: 54.7 mmHg — ABNORMAL HIGH (ref 32.0–48.0)
pH, Arterial: 7.462 — ABNORMAL HIGH (ref 7.350–7.450)
pO2, Arterial: 61.4 mmHg — ABNORMAL LOW (ref 83.0–108.0)

## 2019-03-25 LAB — VANCOMYCIN, TROUGH: Vancomycin Tr: 22 ug/mL (ref 15–20)

## 2019-03-25 NOTE — Progress Notes (Signed)
Pulmonary Critical Care Medicine [facility] [place]   PULMONARY CRITICAL CARE SERVICE  PROGRESS NOTE  Date of Service: 03/25/2019  Dylan Wu  QAS:[id number]  DOB: 1950-11-10   DOA: 03/16/2019  Referring Physician: Carron Curie, MD  HPI: Dilan Wu is a 69 y.o. male seen for follow up of Acute on Chronic Respiratory Failure.  Patient is capping right now 72 hours capping trials  Medications: Reviewed on Rounds  Physical Exam:  Vitals: Temperature 97.4 pulse 69 respiratory 20 blood pressure is 103/59 saturations 97%  Ventilator Settings capping off the ventilator  . General: Comfortable at this time . Eyes: Grossly normal lids, irises & conjunctiva . ENT: grossly tongue is normal . Neck: no obvious mass . Cardiovascular: S1 S2 normal no gallop . Respiratory: No rhonchi no rales . Abdomen: soft . Skin: no rash seen on limited exam . Musculoskeletal: not rigid . Psychiatric:unable to assess . Neurologic: no seizure no involuntary movements         Lab Data:   Basic Metabolic Panel: Recent Labs  Lab 03/19/19 1034 03/21/19 0707 03/22/19 0607 03/22/19 2018 03/23/19 0627  NA  --  139  --   --   --   K 3.3* 2.8* 3.0* 3.0* 3.6  CL  --  98  --   --   --   CO2  --  31  --   --   --   GLUCOSE  --  189*  --   --   --   BUN  --  50*  --   --   --   CREATININE  --  0.81  --   --   --   CALCIUM  --  9.6  --   --   --   MG  --  2.5*  --   --   --     ABG: Recent Labs  Lab 03/25/19 0938  PHART 7.462*  PCO2ART 54.7*  PO2ART 61.4*  HCO3 38.5*  O2SAT 90.8    Liver Function Tests: No results for input(s): AST, ALT, ALKPHOS, BILITOT, PROT, ALBUMIN in the last 168 hours. No results for input(s): LIPASE, AMYLASE in the last 168 hours. No results for input(s): AMMONIA in the last 168 hours.  CBC: Recent Labs  Lab 03/21/19 0707 03/24/19 0431  WBC 7.0 7.4  HGB 12.3* 12.0*  HCT 40.9 41.2  MCV 85.4 88.6  PLT 223 257    Cardiac Enzymes: No  results for input(s): CKTOTAL, CKMB, CKMBINDEX, TROPONINI in the last 168 hours.  BNP (last 3 results) No results for input(s): BNP in the last 8760 hours.  ProBNP (last 3 results) No results for input(s): PROBNP in the last 8760 hours.  Radiological Exams: No results found.  Assessment/Plan Active Problems:   Acute on chronic respiratory failure with hypoxia (HCC)   Chronic diastolic heart failure (HCC)   Pneumonia of lower lobe due to methicillin susceptible Staphylococcus aureus (MSSA) (HCC)   Acute renal failure due to tubular necrosis (HCC)   PTSD (post-traumatic stress disorder)   1. Acute on chronic respiratory failure hypoxia plan is to continue with capping trials right now is on 72-hour goal 2. Chronic diastolic heart failure compensated 3. Due to MSSA treated we will continue with supportive care 4. Acute renal failure improved follow labs 5. PTSD at baseline we will continue supportive care   I have personally seen and evaluated the patient, evaluated laboratory and imaging results, formulated the assessment and plan and placed orders.  The Patient requires high complexity decision making with multiple systems involvement.  Rounds were done with the Respiratory Therapy Director and Staff therapists and discussed with nursing staff also.  Allyne Gee, MD [facility] Pulmonary Critical Care Medicine Sleep Medicine

## 2019-03-26 DIAGNOSIS — I5032 Chronic diastolic (congestive) heart failure: Secondary | ICD-10-CM | POA: Diagnosis not present

## 2019-03-26 DIAGNOSIS — N170 Acute kidney failure with tubular necrosis: Secondary | ICD-10-CM | POA: Diagnosis not present

## 2019-03-26 DIAGNOSIS — J9621 Acute and chronic respiratory failure with hypoxia: Secondary | ICD-10-CM | POA: Diagnosis not present

## 2019-03-26 DIAGNOSIS — J15211 Pneumonia due to Methicillin susceptible Staphylococcus aureus: Secondary | ICD-10-CM | POA: Diagnosis not present

## 2019-03-26 LAB — VANCOMYCIN, TROUGH: Vancomycin Tr: 12 ug/mL — ABNORMAL LOW (ref 15–20)

## 2019-03-26 NOTE — Progress Notes (Signed)
Pulmonary Critical Care Medicine [facility] [place]   PULMONARY CRITICAL CARE SERVICE  PROGRESS NOTE  Date of Service: 03/26/2019  Dylan Wu  EQA:[id number]  DOB: [birth year]   DOA: 03/16/2019  Referring Physician: Carron Curie, MD  HPI: Dylan Wu is a 69 y.o. male seen for follow up of Acute on Chronic Respiratory Failure.  Patient is on capping and is doing well however secretions have been noted to be increased so has been requiring some suctioning  Medications: Reviewed on Rounds  Physical Exam:  Vitals: Check appreciated 97.8 pulse 70 respiratory rate 20 blood pressure is 99/55 saturations 96%  Ventilator Settings capping off the vent  . General: Comfortable at this time . Eyes: Grossly normal lids, irises & conjunctiva . ENT: grossly tongue is normal . Neck: no obvious mass . Cardiovascular: S1 S2 normal no gallop . Respiratory: No rhonchi no rales are noted at this time . Abdomen: soft . Skin: no rash seen on limited exam . Musculoskeletal: not rigid . Psychiatric:unable to assess . Neurologic: no seizure no involuntary movements         Lab Data:   Basic Metabolic Panel: Recent Labs  Lab 03/21/19 0707 03/22/19 0607 03/22/19 2018 03/23/19 0627  NA 139  --   --   --   K 2.8* 3.0* 3.0* 3.6  CL 98  --   --   --   CO2 31  --   --   --   GLUCOSE 189*  --   --   --   BUN 50*  --   --   --   CREATININE 0.81  --   --   --   CALCIUM 9.6  --   --   --   MG 2.5*  --   --   --     ABG: Recent Labs  Lab 03/25/19 0938  PHART 7.462*  PCO2ART 54.7*  PO2ART 61.4*  HCO3 38.5*  O2SAT 90.8    Liver Function Tests: No results for input(s): AST, ALT, ALKPHOS, BILITOT, PROT, ALBUMIN in the last 168 hours. No results for input(s): LIPASE, AMYLASE in the last 168 hours. No results for input(s): AMMONIA in the last 168 hours.  CBC: Recent Labs  Lab 03/21/19 0707 03/24/19 0431  WBC 7.0 7.4  HGB 12.3* 12.0*  HCT 40.9 41.2  MCV 85.4 88.6   PLT 223 257    Cardiac Enzymes: No results for input(s): CKTOTAL, CKMB, CKMBINDEX, TROPONINI in the last 168 hours.  BNP (last 3 results) No results for input(s): BNP in the last 8760 hours.  ProBNP (last 3 results) No results for input(s): PROBNP in the last 8760 hours.  Radiological Exams: No results found.  Assessment/Plan Active Problems:   Acute on chronic respiratory failure with hypoxia (HCC)   Chronic diastolic heart failure (HCC)   Pneumonia of lower lobe due to methicillin susceptible Staphylococcus aureus (MSSA) (HCC)   Acute renal failure due to tubular necrosis (HCC)   PTSD (post-traumatic stress disorder)   1. Acute on chronic respiratory failure with hypoxia plan is to continue with capping trials patient does have retained tracheal secretions requiring some suctioning. 2. Chronic diastolic heart failure right now is compensated 3. Pneumonia treated secondary to MSSA 4. Acute renal failure improving labs 5. PTSD no change   I have personally seen and evaluated the patient, evaluated laboratory and imaging results, formulated the assessment and plan and placed orders. The Patient requires high complexity decision making with multiple systems involvement.  Rounds were done with the Respiratory Therapy Director and Staff therapists and discussed with nursing staff also.  Allyne Gee, MD [facility] Pulmonary Critical Care Medicine Sleep Medicine

## 2019-03-27 DIAGNOSIS — J15211 Pneumonia due to Methicillin susceptible Staphylococcus aureus: Secondary | ICD-10-CM | POA: Diagnosis not present

## 2019-03-27 DIAGNOSIS — I5032 Chronic diastolic (congestive) heart failure: Secondary | ICD-10-CM | POA: Diagnosis not present

## 2019-03-27 DIAGNOSIS — J9621 Acute and chronic respiratory failure with hypoxia: Secondary | ICD-10-CM | POA: Diagnosis not present

## 2019-03-27 DIAGNOSIS — N170 Acute kidney failure with tubular necrosis: Secondary | ICD-10-CM | POA: Diagnosis not present

## 2019-03-27 NOTE — Progress Notes (Signed)
Pulmonary Critical Care Medicine [facility] [place]   PULMONARY CRITICAL CARE SERVICE  PROGRESS NOTE  Date of Service: 03/27/2019  [name]  [id number]  DOB: 1951/02/14   DOA: 03/16/2019  Referring Physician: Carron Curie, MD  HPI: Dylan Wu is a 69 y.o. male seen for follow up of Acute on Chronic Respiratory Failure.  Patient is currently capping and has been on 6 L oxygen  Medications: Reviewed on Rounds  Physical Exam:  Vitals: Temperature 98.6 pulse 96 respiratory 29 blood pressure 124/54 saturations 97%  Ventilator Settings capping off the ventilator  . General: Comfortable at this time . Eyes: Grossly normal lids, irises & conjunctiva . ENT: grossly tongue is normal . Neck: no obvious mass . Cardiovascular: S1 S2 normal no gallop . Respiratory: No rhonchi no rales are noted at this time . Abdomen: soft . Skin: no rash seen on limited exam . Musculoskeletal: not rigid . Psychiatric:unable to assess . Neurologic: no seizure no involuntary movements         Lab Data:   Basic Metabolic Panel: Recent Labs  Lab 03/21/19 0707 03/22/19 0607 03/22/19 2018 03/23/19 0627  NA 139  --   --   --   K 2.8* 3.0* 3.0* 3.6  CL 98  --   --   --   CO2 31  --   --   --   GLUCOSE 189*  --   --   --   BUN 50*  --   --   --   CREATININE 0.81  --   --   --   CALCIUM 9.6  --   --   --   MG 2.5*  --   --   --     ABG: Recent Labs  Lab 03/25/19 0938  PHART 7.462*  PCO2ART 54.7*  PO2ART 61.4*  HCO3 38.5*  O2SAT 90.8    Liver Function Tests: No results for input(s): AST, ALT, ALKPHOS, BILITOT, PROT, ALBUMIN in the last 168 hours. No results for input(s): LIPASE, AMYLASE in the last 168 hours. No results for input(s): AMMONIA in the last 168 hours.  CBC: Recent Labs  Lab 03/21/19 0707 03/24/19 0431  WBC 7.0 7.4  HGB 12.3* 12.0*  HCT 40.9 41.2  MCV 85.4 88.6  PLT 223 257    Cardiac Enzymes: No results for input(s): CKTOTAL, CKMB,  CKMBINDEX, TROPONINI in the last 168 hours.  BNP (last 3 results) No results for input(s): BNP in the last 8760 hours.  ProBNP (last 3 results) No results for input(s): PROBNP in the last 8760 hours.  Radiological Exams: No results found.  Assessment/Plan Active Problems:   Acute on chronic respiratory failure with hypoxia (HCC)   Chronic diastolic heart failure (HCC)   Pneumonia of lower lobe due to methicillin susceptible Staphylococcus aureus (MSSA) (HCC)   Acute renal failure due to tubular necrosis (HCC)   PTSD (post-traumatic stress disorder)   1. Acute on chronic respiratory failure hypoxia plan is to continue with capping hold off on decannulation right now on 6 L oxygen 2. Chronic diastolic heart failure appears to be compensated 3. Pneumonia improving slowly secondary to MSSA 4. Acute renal failure we will continue to monitor labs 5. PTSD at baseline   I have personally seen and evaluated the patient, evaluated laboratory and imaging results, formulated the assessment and plan and placed orders. The Patient requires high complexity decision making with multiple systems involvement.  Rounds were done with the Respiratory Therapy Director and  Staff therapists and discussed with nursing staff also.  Allyne Gee, MD [facility] Pulmonary Critical Care Medicine Sleep Medicine

## 2019-03-28 DIAGNOSIS — J9621 Acute and chronic respiratory failure with hypoxia: Secondary | ICD-10-CM | POA: Diagnosis not present

## 2019-03-28 DIAGNOSIS — I5032 Chronic diastolic (congestive) heart failure: Secondary | ICD-10-CM | POA: Diagnosis not present

## 2019-03-28 DIAGNOSIS — J15211 Pneumonia due to Methicillin susceptible Staphylococcus aureus: Secondary | ICD-10-CM | POA: Diagnosis not present

## 2019-03-28 DIAGNOSIS — N170 Acute kidney failure with tubular necrosis: Secondary | ICD-10-CM | POA: Diagnosis not present

## 2019-03-28 LAB — BLOOD GAS, ARTERIAL
Acid-Base Excess: 17.3 mmol/L — ABNORMAL HIGH (ref 0.0–2.0)
Bicarbonate: 42.4 mmol/L — ABNORMAL HIGH (ref 20.0–28.0)
FIO2: 21
O2 Saturation: 88.5 %
Patient temperature: 36.3
pCO2 arterial: 60.2 mmHg — ABNORMAL HIGH (ref 32.0–48.0)
pH, Arterial: 7.462 — ABNORMAL HIGH (ref 7.350–7.450)
pO2, Arterial: 59.5 mmHg — ABNORMAL LOW (ref 83.0–108.0)

## 2019-03-28 LAB — VANCOMYCIN, TROUGH: Vancomycin Tr: 23 ug/mL (ref 15–20)

## 2019-03-28 NOTE — Progress Notes (Addendum)
Pulmonary Critical Care Medicine [facility] [place]   PULMONARY CRITICAL CARE SERVICE  PROGRESS NOTE  Date of Service: 03/28/2019  Dylan Wu  QIH:[id number]  DOB: 12/21/1950   DOA: 03/16/2019  Referring Physician: Carron Curie, MD  HPI: Dylan Wu is a 69 y.o. male seen for follow up of Acute on Chronic Respiratory Failure.  Patient has been capped for 5 days remains on 2 L of oxygen via nasal cannula satting well with no secretions.  Medications: Reviewed on Rounds  Physical Exam:  Vitals: Pulse 68 respirations 24 BP 107/55 O2 sat 90% temp 96.7  Ventilator Settings 2 L nasal cannula  . General: Comfortable at this time . Eyes: Grossly normal lids, irises & conjunctiva . ENT: grossly tongue is normal . Neck: no obvious mass . Cardiovascular: S1 S2 normal no gallop . Respiratory: No rales or rhonchi noted . Abdomen: soft . Skin: no rash seen on limited exam . Musculoskeletal: not rigid . Psychiatric:unable to assess . Neurologic: no seizure no involuntary movements         Lab Data:   Basic Metabolic Panel: Recent Labs  Lab 03/22/19 0607 03/22/19 2018 03/23/19 0627  K 3.0* 3.0* 3.6    ABG: Recent Labs  Lab 03/25/19 0938 03/28/19 0800  PHART 7.462* 7.462*  PCO2ART 54.7* 60.2*  PO2ART 61.4* 59.5*  HCO3 38.5* 42.4*  O2SAT 90.8 88.5    Liver Function Tests: No results for input(s): AST, ALT, ALKPHOS, BILITOT, PROT, ALBUMIN in the last 168 hours. No results for input(s): LIPASE, AMYLASE in the last 168 hours. No results for input(s): AMMONIA in the last 168 hours.  CBC: Recent Labs  Lab 03/24/19 0431  WBC 7.4  HGB 12.0*  HCT 41.2  MCV 88.6  PLT 257    Cardiac Enzymes: No results for input(s): CKTOTAL, CKMB, CKMBINDEX, TROPONINI in the last 168 hours.  BNP (last 3 results) No results for input(s): BNP in the last 8760 hours.  ProBNP (last 3 results) No results for input(s): PROBNP in the last 8760 hours.  Radiological  Exams: No results found.  Assessment/Plan Active Problems:   Acute on chronic respiratory failure with hypoxia (HCC)   Chronic diastolic heart failure (HCC)   Pneumonia of lower lobe due to methicillin susceptible Staphylococcus aureus (MSSA) (HCC)   Acute renal failure due to tubular necrosis (HCC)   PTSD (post-traumatic stress disorder)   1. Acute on chronic respiratory failure hypoxia plan is to continue with capping hold off on decannulation right now on 2L oxygen 2. Chronic diastolic heart failure appears to be compensated 3. Pneumonia improving slowly secondary to MSSA 4. Acute renal failure we will continue to monitor labs 5. PTSD at baseline    I have personally seen and evaluated the patient, evaluated laboratory and imaging results, formulated the assessment and plan and placed orders. The Patient requires high complexity decision making with multiple systems involvement.  Rounds were done with the Respiratory Therapy Director and Staff therapists and discussed with nursing staff also.  Yevonne Pax, MD [facility] Pulmonary Critical Care Medicine Sleep Medicine

## 2019-03-29 DIAGNOSIS — J15211 Pneumonia due to Methicillin susceptible Staphylococcus aureus: Secondary | ICD-10-CM | POA: Diagnosis not present

## 2019-03-29 DIAGNOSIS — I5032 Chronic diastolic (congestive) heart failure: Secondary | ICD-10-CM | POA: Diagnosis not present

## 2019-03-29 DIAGNOSIS — N170 Acute kidney failure with tubular necrosis: Secondary | ICD-10-CM | POA: Diagnosis not present

## 2019-03-29 DIAGNOSIS — J9621 Acute and chronic respiratory failure with hypoxia: Secondary | ICD-10-CM | POA: Diagnosis not present

## 2019-03-29 LAB — VANCOMYCIN, TROUGH: Vancomycin Tr: 15 ug/mL (ref 15–20)

## 2019-03-29 NOTE — Progress Notes (Signed)
Pulmonary Critical Care Medicine [facility] [place]   PULMONARY CRITICAL CARE SERVICE  PROGRESS NOTE  Date of Service: 03/29/2019  Dylan Wu  [id number]:[id number]  DOB: Aug 01, 1950   DOA: 03/16/2019  Referring Physician: Carron Curie, MD  HPI: Dylan Wu is a 69 y.o. male seen for follow up of Acute on Chronic Respiratory Failure.  Patient is decannulated right now has been using nasal cannula with good saturations.  Comfortable without distress  Medications: Reviewed on Rounds  Physical Exam:  Vitals: Temperature 96.9 pulse 69 respiratory 18 blood pressure 121/53 saturations 96%  Ventilator Settings decannulated on nasal cannula  . General: Comfortable at this time . Eyes: Grossly normal lids, irises & conjunctiva . ENT: grossly tongue is normal . Neck: no obvious mass . Cardiovascular: S1 S2 normal no gallop . Respiratory: No rhonchi no rales are noted at this time . Abdomen: soft . Skin: no rash seen on limited exam . Musculoskeletal: not rigid . Psychiatric:unable to assess . Neurologic: no seizure no involuntary movements         Lab Data:   Basic Metabolic Panel: Recent Labs  Lab 03/22/19 2018 03/23/19 0627  K 3.0* 3.6    ABG: Recent Labs  Lab 03/25/19 0938 03/28/19 0800  PHART 7.462* 7.462*  PCO2ART 54.7* 60.2*  PO2ART 61.4* 59.5*  HCO3 38.5* 42.4*  O2SAT 90.8 88.5    Liver Function Tests: No results for input(s): AST, ALT, ALKPHOS, BILITOT, PROT, ALBUMIN in the last 168 hours. No results for input(s): LIPASE, AMYLASE in the last 168 hours. No results for input(s): AMMONIA in the last 168 hours.  CBC: Recent Labs  Lab 03/24/19 0431  WBC 7.4  HGB 12.0*  HCT 41.2  MCV 88.6  PLT 257    Cardiac Enzymes: No results for input(s): CKTOTAL, CKMB, CKMBINDEX, TROPONINI in the last 168 hours.  BNP (last 3 results) No results for input(s): BNP in the last 8760 hours.  ProBNP (last 3 results) No results for input(s): PROBNP in  the last 8760 hours.  Radiological Exams: No results found.  Assessment/Plan Active Problems:   Acute on chronic respiratory failure with hypoxia (HCC)   Chronic diastolic heart failure (HCC)   Pneumonia of lower lobe due to methicillin susceptible Staphylococcus aureus (MSSA) (HCC)   Acute renal failure due to tubular necrosis (HCC)   PTSD (post-traumatic stress disorder)   1. Acute on chronic respiratory failure with hypoxia plan is to continue with nasal cannula titrate oxygen down as tolerated 2. Chronic diastolic heart failure right now is compensated 3. Pneumonia improving though slowly 4. Acute renal failure resolved 5. PTSD no change   I have personally seen and evaluated the patient, evaluated laboratory and imaging results, formulated the assessment and plan and placed orders. The Patient requires high complexity decision making with multiple systems involvement.  Rounds were done with the Respiratory Therapy Director and Staff therapists and discussed with nursing staff also.  Yevonne Pax, MD [facility] Pulmonary Critical Care Medicine Sleep Medicine

## 2019-03-30 ENCOUNTER — Other Ambulatory Visit (HOSPITAL_COMMUNITY): Payer: Medicare PPO

## 2019-03-30 ENCOUNTER — Other Ambulatory Visit: Payer: Self-pay

## 2019-03-30 DIAGNOSIS — J15211 Pneumonia due to Methicillin susceptible Staphylococcus aureus: Secondary | ICD-10-CM | POA: Diagnosis not present

## 2019-03-30 DIAGNOSIS — N170 Acute kidney failure with tubular necrosis: Secondary | ICD-10-CM | POA: Diagnosis not present

## 2019-03-30 DIAGNOSIS — I5032 Chronic diastolic (congestive) heart failure: Secondary | ICD-10-CM | POA: Diagnosis not present

## 2019-03-30 DIAGNOSIS — J9621 Acute and chronic respiratory failure with hypoxia: Secondary | ICD-10-CM | POA: Diagnosis not present

## 2019-03-30 NOTE — Progress Notes (Signed)
Pulmonary Critical Care Medicine [facility] [place]   PULMONARY CRITICAL CARE SERVICE  PROGRESS NOTE  Date of Service: 03/30/2019  Dylan Wu  QPY:[id number]  DOB: [birth year]   DOA: 03/16/2019  Referring Physician: Carron Curie, MD  HPI: Dylan Wu is a 69 y.o. male seen for follow up of Acute on Chronic Respiratory Failure.  Patient currently is on the BiPAP at nighttime and using 2 L oxygen during the daytime.  Medications: Reviewed on Rounds  Physical Exam:  Vitals: Temperature 97.5 pulse 69 respiratory rate 22 blood pressure is 112/58 saturations 95%  Ventilator Settings currently on 2 L oxygen  . General: Comfortable at this time . Eyes: Grossly normal lids, irises & conjunctiva . ENT: grossly tongue is normal . Neck: no obvious mass . Cardiovascular: S1 S2 normal no gallop . Respiratory: No rhonchi no rales are noted at this time . Abdomen: soft . Skin: no rash seen on limited exam . Musculoskeletal: not rigid . Psychiatric:unable to assess . Neurologic: no seizure no involuntary movements         Lab Data:   Basic Metabolic Panel: No results for input(s): NA, K, CL, CO2, GLUCOSE, BUN, CREATININE, CALCIUM, MG, PHOS in the last 168 hours.  ABG: Recent Labs  Lab 03/25/19 0938 03/28/19 0800  PHART 7.462* 7.462*  PCO2ART 54.7* 60.2*  PO2ART 61.4* 59.5*  HCO3 38.5* 42.4*  O2SAT 90.8 88.5    Liver Function Tests: No results for input(s): AST, ALT, ALKPHOS, BILITOT, PROT, ALBUMIN in the last 168 hours. No results for input(s): LIPASE, AMYLASE in the last 168 hours. No results for input(s): AMMONIA in the last 168 hours.  CBC: Recent Labs  Lab 03/24/19 0431  WBC 7.4  HGB 12.0*  HCT 41.2  MCV 88.6  PLT 257    Cardiac Enzymes: No results for input(s): CKTOTAL, CKMB, CKMBINDEX, TROPONINI in the last 168 hours.  BNP (last 3 results) No results for input(s): BNP in the last 8760 hours.  ProBNP (last 3 results) No results for  input(s): PROBNP in the last 8760 hours.  Radiological Exams: No results found.  Assessment/Plan Active Problems:   Acute on chronic respiratory failure with hypoxia (HCC)   Chronic diastolic heart failure (HCC)   Pneumonia of lower lobe due to methicillin susceptible Staphylococcus aureus (MSSA) (HCC)   Acute renal failure due to tubular necrosis (HCC)   PTSD (post-traumatic stress disorder)   1. Acute on chronic respiratory failure hypoxia plan is to continue with BiPAP usage at nighttime and oxygen 2 L during the daytime 2. Chronic diastolic heart failure compensated 3. Pneumonia due to MSSA treated we will continue to follow 4. Acute renal failure no change 5. PTSD patient is at baseline we will continue present therapy   I have personally seen and evaluated the patient, evaluated laboratory and imaging results, formulated the assessment and plan and placed orders. The Patient requires high complexity decision making with multiple systems involvement.  Rounds were done with the Respiratory Therapy Director and Staff therapists and discussed with nursing staff also.  Time 35 minutes  Yevonne Pax, MD [facility] Pulmonary Critical Care Medicine Sleep Medicine

## 2019-03-31 DIAGNOSIS — I5032 Chronic diastolic (congestive) heart failure: Secondary | ICD-10-CM | POA: Diagnosis not present

## 2019-03-31 DIAGNOSIS — N170 Acute kidney failure with tubular necrosis: Secondary | ICD-10-CM | POA: Diagnosis not present

## 2019-03-31 DIAGNOSIS — J15211 Pneumonia due to Methicillin susceptible Staphylococcus aureus: Secondary | ICD-10-CM | POA: Diagnosis not present

## 2019-03-31 DIAGNOSIS — J9621 Acute and chronic respiratory failure with hypoxia: Secondary | ICD-10-CM | POA: Diagnosis not present

## 2019-03-31 NOTE — Progress Notes (Signed)
Pulmonary Critical Care Medicine [facility] [place]   PULMONARY CRITICAL CARE SERVICE  PROGRESS NOTE  Date of Service: 03/31/2019  Dylan Wu  ZHY:[id number]  DOB: 1951-01-25   DOA: 03/16/2019  Referring Physician: Carron Curie, MD  HPI: Dylan Wu is a 69 y.o. male seen for follow up of Acute on Chronic Respiratory Failure.  Patient currently is on nasal cannula has been using BiPAP at nighttime without any issues  Medications: Reviewed on Rounds  Physical Exam:  Vitals: Temperature is 98.2 pulse 65 respiratory rate 30 blood pressure is 113/58 saturations 98%  Ventilator Settings BiPAP at night  . General: Comfortable at this time . Eyes: Grossly normal lids, irises & conjunctiva . ENT: grossly tongue is normal . Neck: no obvious mass . Cardiovascular: S1 S2 normal no gallop . Respiratory: No rhonchi no rales are noted at this time . Abdomen: soft . Skin: no rash seen on limited exam . Musculoskeletal: not rigid . Psychiatric:unable to assess . Neurologic: no seizure no involuntary movements         Lab Data:   Basic Metabolic Panel: No results for input(s): NA, K, CL, CO2, GLUCOSE, BUN, CREATININE, CALCIUM, MG, PHOS in the last 168 hours.  ABG: Recent Labs  Lab 03/25/19 0938 03/28/19 0800  PHART 7.462* 7.462*  PCO2ART 54.7* 60.2*  PO2ART 61.4* 59.5*  HCO3 38.5* 42.4*  O2SAT 90.8 88.5    Liver Function Tests: No results for input(s): AST, ALT, ALKPHOS, BILITOT, PROT, ALBUMIN in the last 168 hours. No results for input(s): LIPASE, AMYLASE in the last 168 hours. No results for input(s): AMMONIA in the last 168 hours.  CBC: No results for input(s): WBC, NEUTROABS, HGB, HCT, MCV, PLT in the last 168 hours.  Cardiac Enzymes: No results for input(s): CKTOTAL, CKMB, CKMBINDEX, TROPONINI in the last 168 hours.  BNP (last 3 results) No results for input(s): BNP in the last 8760 hours.  ProBNP (last 3 results) No results for input(s):  PROBNP in the last 8760 hours.  Radiological Exams: DG Abd 1 View  Result Date: 03/30/2019 CLINICAL DATA:  Abdominal distension EXAM: ABDOMEN - 1 VIEW COMPARISON:  03/16/2019 FINDINGS: Gastrostomy tube in place. Small amount of gas in the small intestine. Moderate amount of gas in the colon, particularly in the transverse colon, as can be seen in debilitated patients in the supine position. No sign of free air. IMPRESSION: Moderate amount of colonic gas, particularly in the transverse colon, often seen in debilitated patients lying in the supine position. Electronically Signed   By: Paulina Fusi M.D.   On: 03/30/2019 13:26    Assessment/Plan Active Problems:   Acute on chronic respiratory failure with hypoxia (HCC)   Chronic diastolic heart failure (HCC)   Pneumonia of lower lobe due to methicillin susceptible Staphylococcus aureus (MSSA) (HCC)   Acute renal failure due to tubular necrosis (HCC)   PTSD (post-traumatic stress disorder)   1. Acute on chronic respiratory failure hypoxia plan is to continue with the BiPAP at nighttime titrate as necessary 2. Chronic diastolic heart failure compensated monitor fluid status 3. Pneumonia treated clinically is improving 4. Acute renal failure following labs 5. PTSD no changes   I have personally seen and evaluated the patient, evaluated laboratory and imaging results, formulated the assessment and plan and placed orders. The Patient requires high complexity decision making with multiple systems involvement.  Rounds were done with the Respiratory Therapy Director and Staff therapists and discussed with nursing staff also.  Yevonne Pax,  MD [facility] Pulmonary Critical Care Medicine Sleep Medicine

## 2019-04-02 LAB — BASIC METABOLIC PANEL
Anion gap: 13 (ref 5–15)
BUN: 69 mg/dL — ABNORMAL HIGH (ref 8–23)
CO2: 37 mmol/L — ABNORMAL HIGH (ref 22–32)
Calcium: 9.1 mg/dL (ref 8.9–10.3)
Chloride: 87 mmol/L — ABNORMAL LOW (ref 98–111)
Creatinine, Ser: 1.25 mg/dL — ABNORMAL HIGH (ref 0.61–1.24)
GFR calc Af Amer: >60 mL/min (ref >60)
GFR calc non Af Amer: 59 mL/min — ABNORMAL LOW (ref >60)
Glucose, Bld: 189 mg/dL — ABNORMAL HIGH (ref 70–99)
Potassium: 2.1 mmol/L — CL (ref 3.5–5.1)
Sodium: 137 mmol/L (ref 135–145)

## 2019-04-02 LAB — CBC
HCT: 36.3 % — ABNORMAL LOW (ref 39.0–52.0)
Hemoglobin: 10.9 g/dL — ABNORMAL LOW (ref 13.0–17.0)
MCH: 25.5 pg — ABNORMAL LOW (ref 26.0–34.0)
MCHC: 30 g/dL (ref 30.0–36.0)
MCV: 85 fL (ref 80.0–100.0)
Platelets: 109 10*3/uL — ABNORMAL LOW (ref 150–400)
RBC: 4.27 MIL/uL (ref 4.22–5.81)
RDW: 14.6 % (ref 11.5–15.5)
WBC: 7.7 10*3/uL (ref 4.0–10.5)
nRBC: 0 % (ref 0.0–0.2)

## 2019-04-02 LAB — HEMOGLOBIN A1C
Hgb A1c MFr Bld: 8.4 % — ABNORMAL HIGH (ref 4.8–5.6)
Mean Plasma Glucose: 194.38 mg/dL

## 2019-04-03 LAB — POTASSIUM
Potassium: 2.9 mmol/L — ABNORMAL LOW (ref 3.5–5.1)
Potassium: 3.3 mmol/L — ABNORMAL LOW (ref 3.5–5.1)

## 2019-04-04 LAB — POTASSIUM: Potassium: 2.8 mmol/L — ABNORMAL LOW (ref 3.5–5.1)

## 2019-04-05 LAB — BASIC METABOLIC PANEL
Anion gap: 12 (ref 5–15)
BUN: 27 mg/dL — ABNORMAL HIGH (ref 8–23)
CO2: 32 mmol/L (ref 22–32)
Calcium: 9.4 mg/dL (ref 8.9–10.3)
Chloride: 95 mmol/L — ABNORMAL LOW (ref 98–111)
Creatinine, Ser: 0.91 mg/dL (ref 0.61–1.24)
GFR calc Af Amer: >60 mL/min (ref >60)
GFR calc non Af Amer: >60 mL/min (ref >60)
Glucose, Bld: 143 mg/dL — ABNORMAL HIGH (ref 70–99)
Potassium: 2.6 mmol/L — CL (ref 3.5–5.1)
Sodium: 139 mmol/L (ref 135–145)

## 2019-04-05 LAB — POTASSIUM: Potassium: 3 mmol/L — ABNORMAL LOW (ref 3.5–5.1)

## 2019-04-06 LAB — POTASSIUM: Potassium: 2.9 mmol/L — ABNORMAL LOW (ref 3.5–5.1)

## 2019-04-07 LAB — BASIC METABOLIC PANEL
Anion gap: 18 — ABNORMAL HIGH (ref 5–15)
BUN: 30 mg/dL — ABNORMAL HIGH (ref 8–23)
CO2: 30 mmol/L (ref 22–32)
Calcium: 9.3 mg/dL (ref 8.9–10.3)
Chloride: 92 mmol/L — ABNORMAL LOW (ref 98–111)
Creatinine, Ser: 1.04 mg/dL (ref 0.61–1.24)
GFR calc Af Amer: >60 mL/min (ref >60)
GFR calc non Af Amer: >60 mL/min (ref >60)
Glucose, Bld: 147 mg/dL — ABNORMAL HIGH (ref 70–99)
Potassium: 2.5 mmol/L — CL (ref 3.5–5.1)
Sodium: 140 mmol/L (ref 135–145)

## 2019-04-07 LAB — POTASSIUM: Potassium: 2.9 mmol/L — ABNORMAL LOW (ref 3.5–5.1)

## 2019-04-08 ENCOUNTER — Other Ambulatory Visit (HOSPITAL_COMMUNITY): Payer: Medicare PPO

## 2019-04-08 LAB — POTASSIUM: Potassium: 3.3 mmol/L — ABNORMAL LOW (ref 3.5–5.1)

## 2019-04-08 LAB — SARS CORONAVIRUS 2 (TAT 6-24 HRS): SARS Coronavirus 2: NEGATIVE

## 2019-04-09 LAB — POTASSIUM: Potassium: 3.6 mmol/L (ref 3.5–5.1)

## 2020-06-01 IMAGING — DX DG CHEST 1V PORT
1 series · 1 of 1 positions shown · non-contrast
Comparison: Chest x-ray 03/16/2019.

CLINICAL DATA: 68-year-old male with history of pneumonia.

EXAM:
PORTABLE CHEST 1 VIEW

[chest]
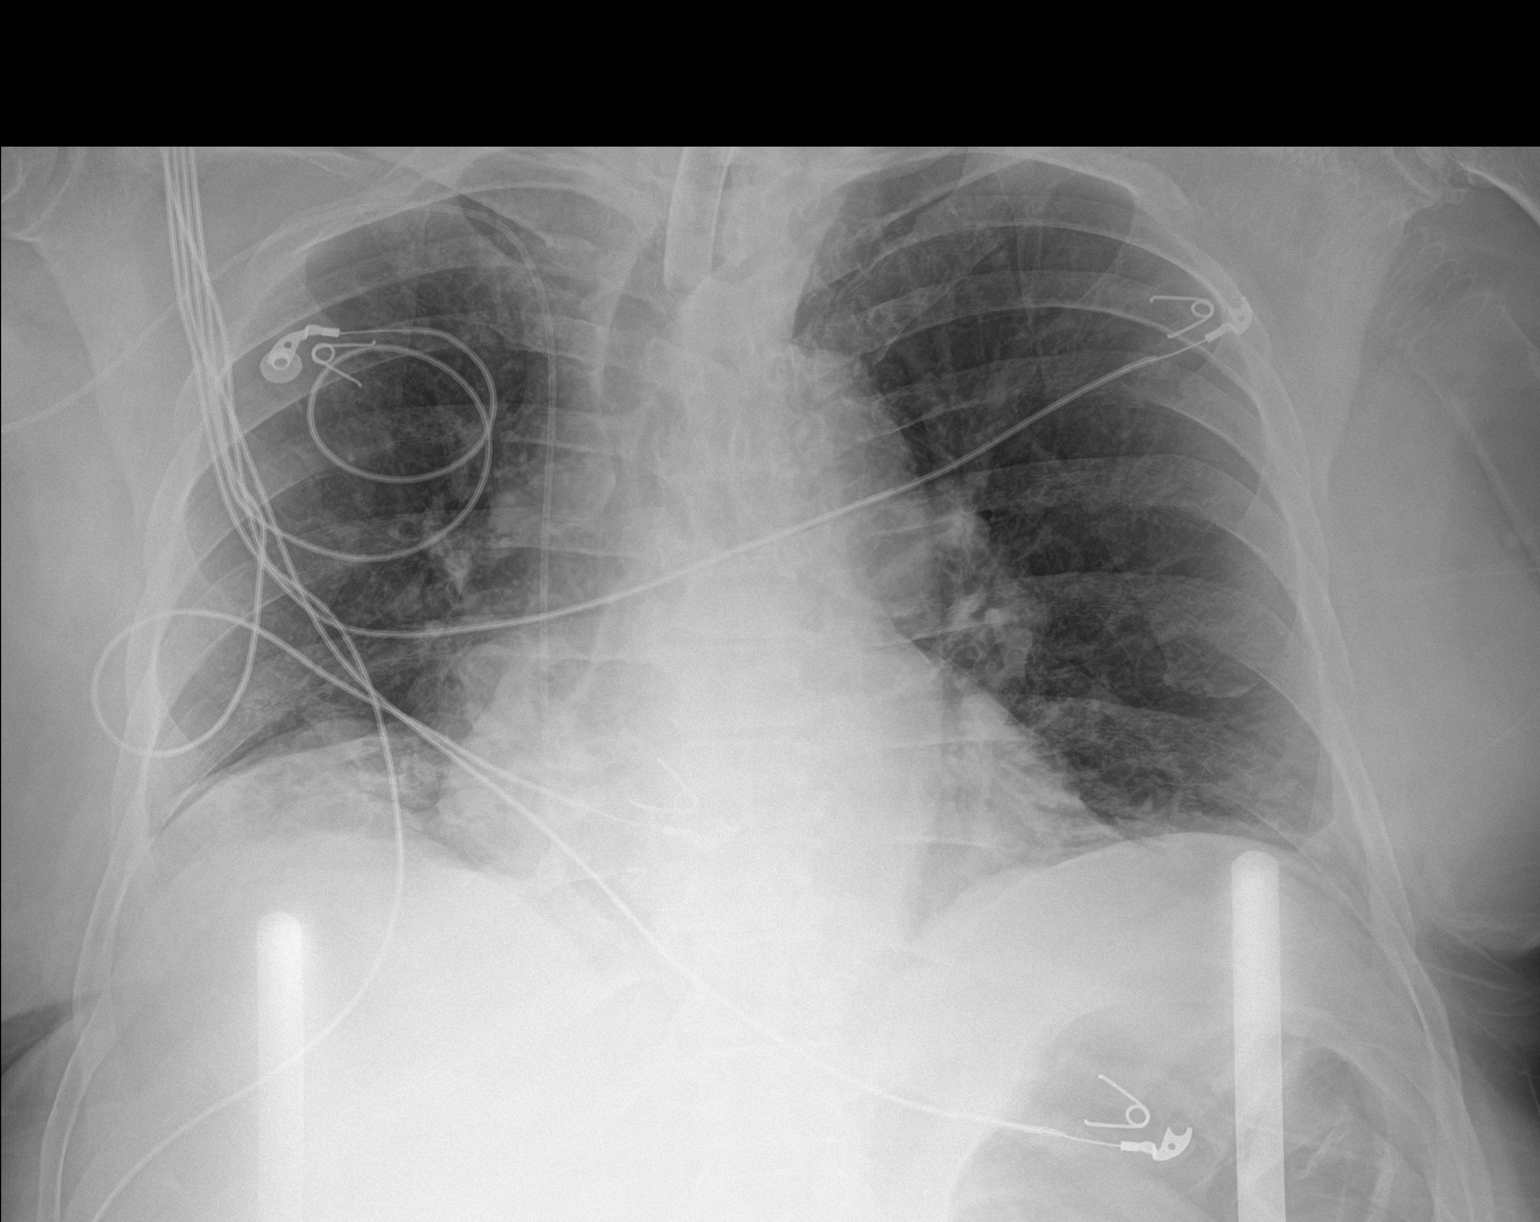

[1 of 1 positions shown; findings below may reference images not displayed]

FINDINGS: A tracheostomy tube is in place with tip 5.8 cm above the carina.
There is a right upper extremity PICC with tip terminating in the
right atrium. Lung volumes are low. Bibasilar opacities (right
greater than left), favored to reflect predominantly subsegmental
atelectasis, although underlying airspace consolidation is not
entirely excluded. No definite pleural effusions. No evidence of
pulmonary edema. No pneumothorax. Heart size is borderline enlarged,
accentuated by low lung volumes, portable AP technique, and slight
rotation of the patient to the right which also distorts upper
mediastinal contours.
IMPRESSION: 1. Support apparatus, as above.
2. Low lung volumes with probable bibasilar subsegmental
atelectasis.

## 2020-06-11 IMAGING — DX DG ABDOMEN 1V
1 series · 2 of 2 positions shown · non-contrast
Comparison: 03/16/2019

CLINICAL DATA: Abdominal distension

EXAM:
ABDOMEN - 1 VIEW

[Series 1: abdomen · 0.14mm/px · 2 of 2 slices shown]
[im 1/2]
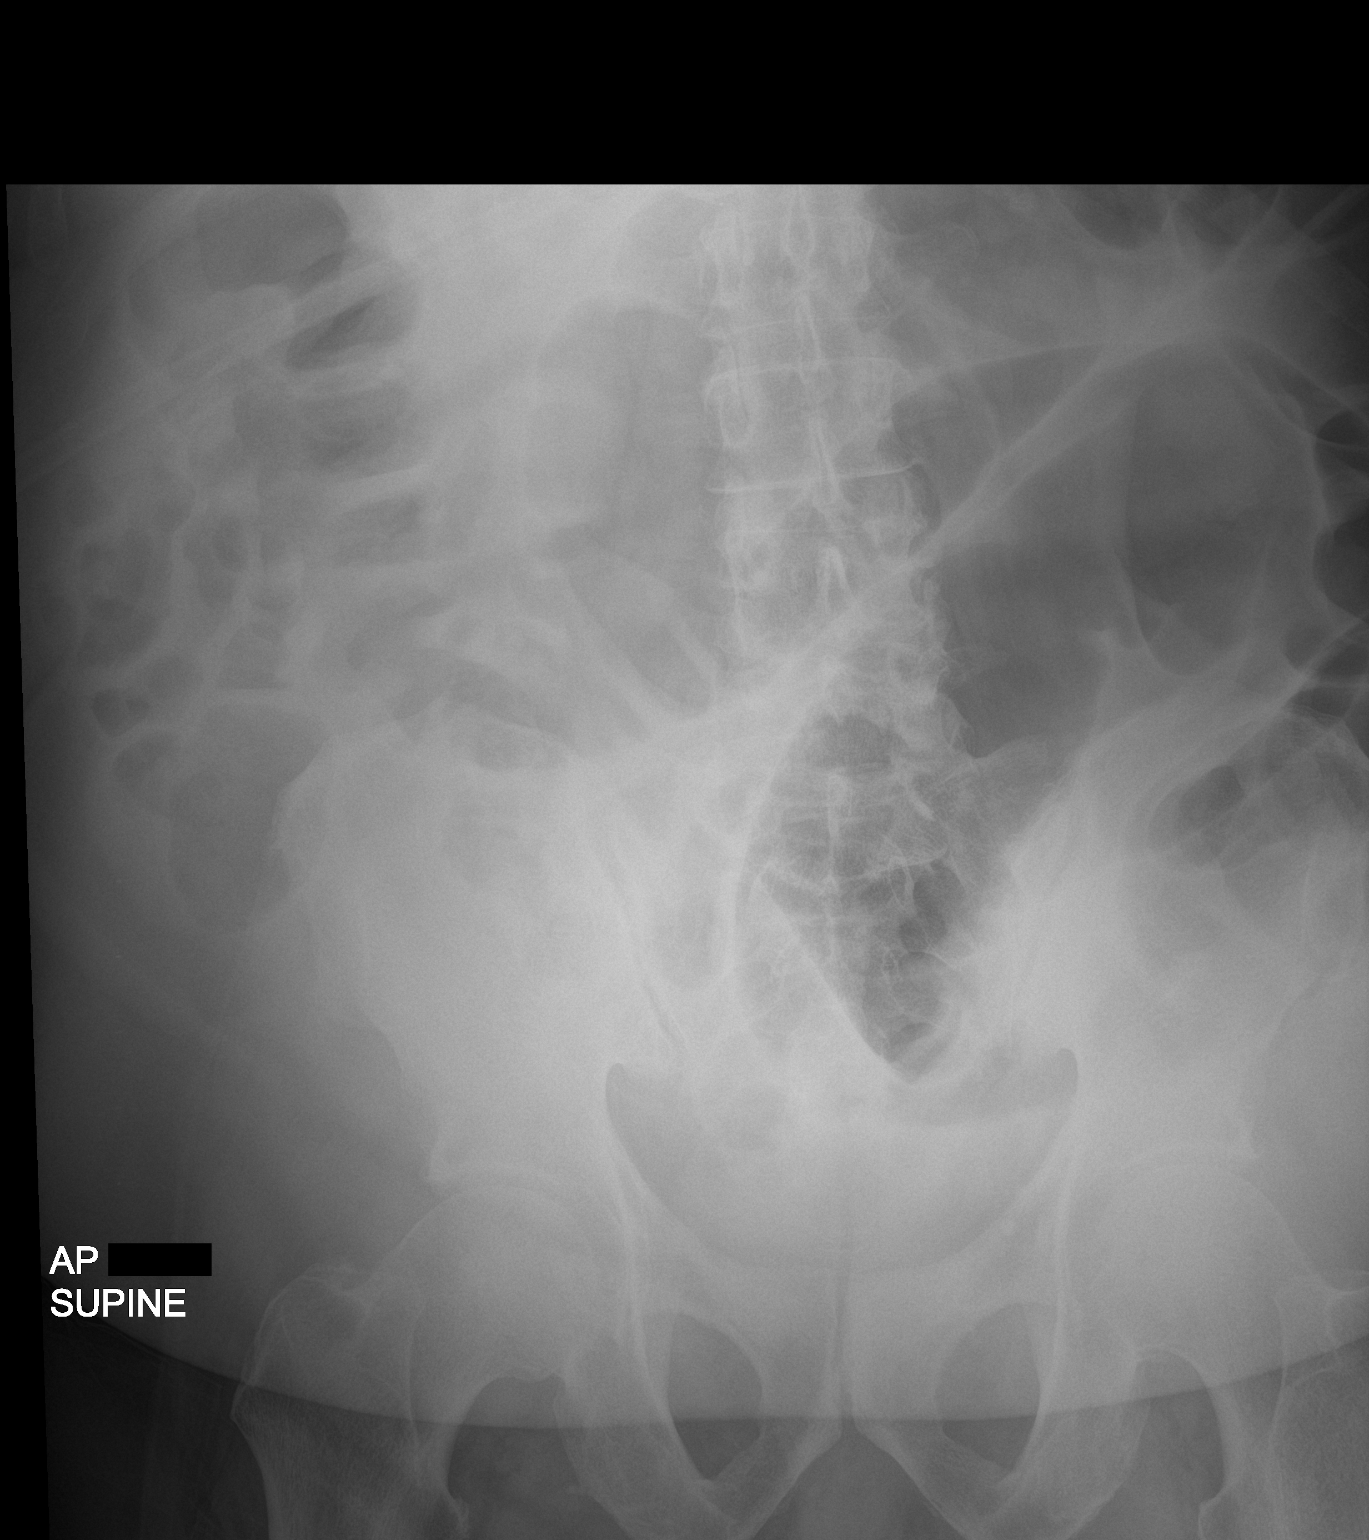
[im 2/2]
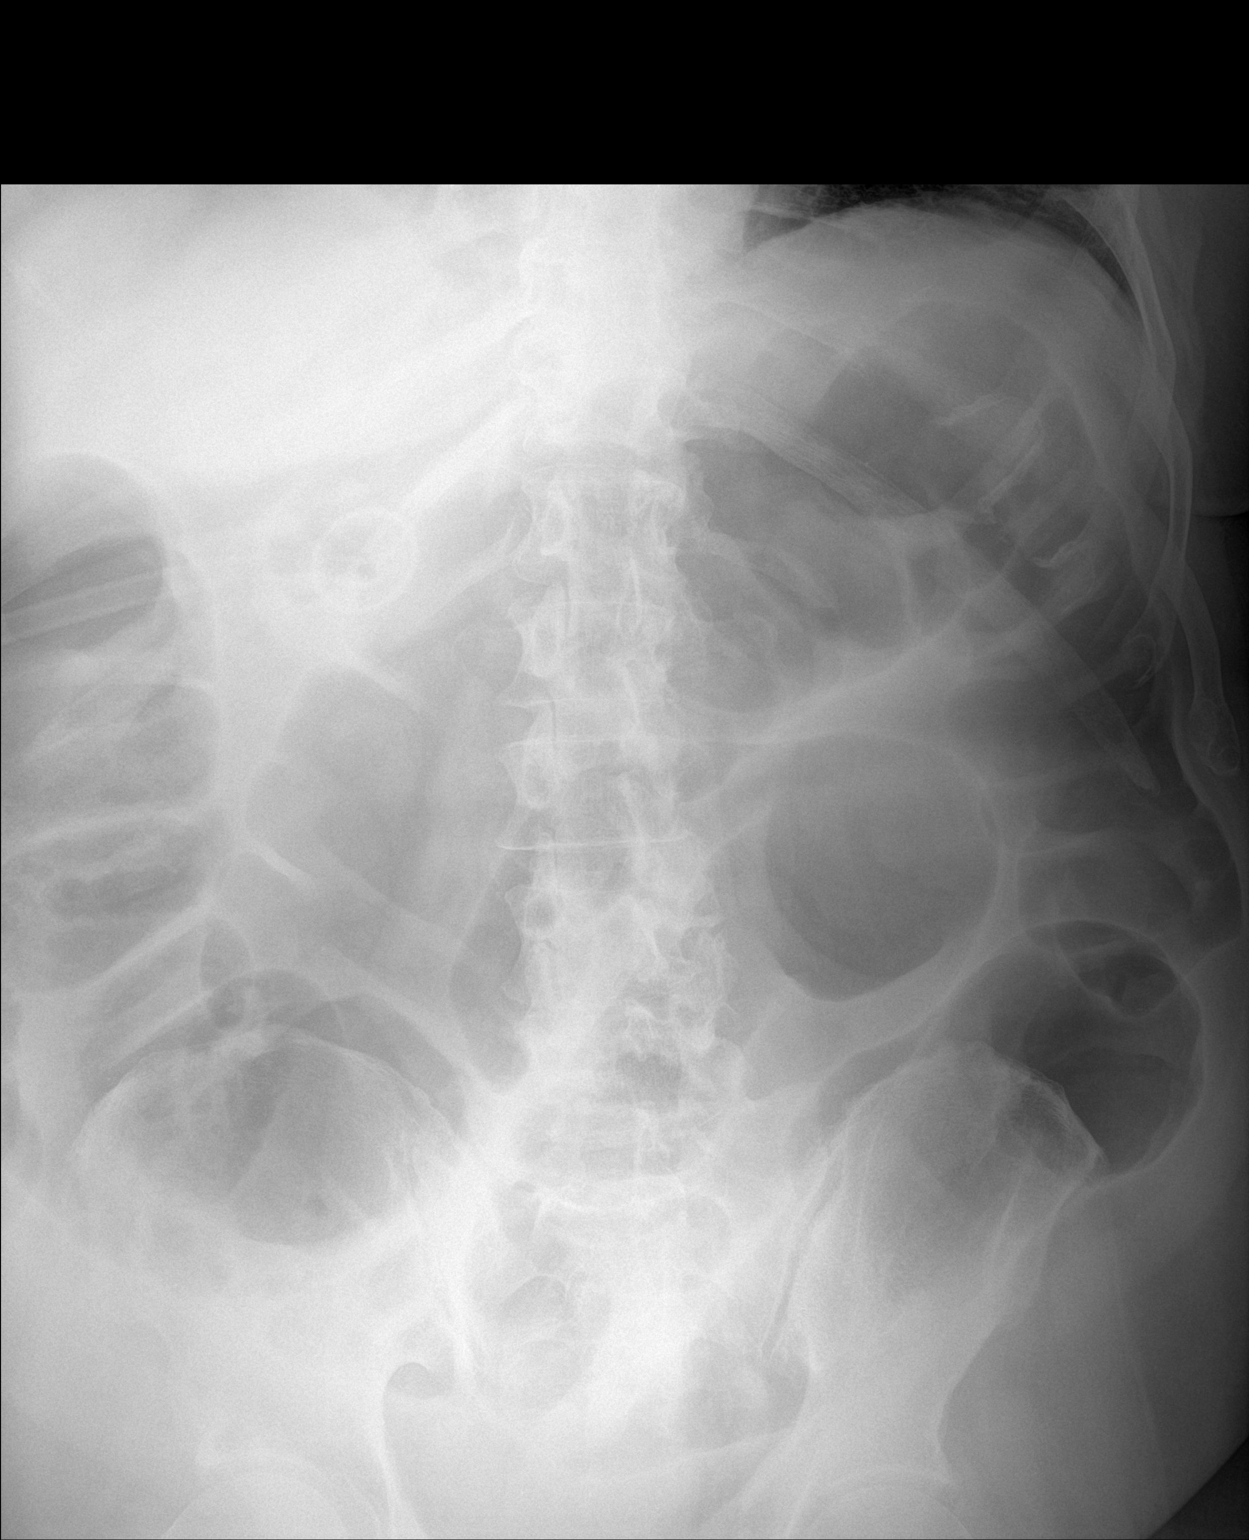

[2 of 2 positions shown; findings below may reference images not displayed]

FINDINGS: Gastrostomy tube in place. Small amount of gas in the small
intestine. Moderate amount of gas in the colon, particularly in the
transverse colon, as can be seen in debilitated patients in the
supine position. No sign of free air.
IMPRESSION: Moderate amount of colonic gas, particularly in the transverse
colon, often seen in debilitated patients lying in the supine
position.
